# Patient Record
Sex: Female | Born: 1960
Health system: Southern US, Community
[De-identification: ages and names within clinical notes are randomized; demographics above are authoritative.]

## PROBLEM LIST (undated history)

## (undated) DIAGNOSIS — K603 Anal fistula, unspecified: Secondary | ICD-10-CM

## (undated) DIAGNOSIS — I493 Ventricular premature depolarization: Secondary | ICD-10-CM

## (undated) DIAGNOSIS — Z8719 Personal history of other diseases of the digestive system: Secondary | ICD-10-CM

## (undated) DIAGNOSIS — E785 Hyperlipidemia, unspecified: Secondary | ICD-10-CM

## (undated) DIAGNOSIS — Z973 Presence of spectacles and contact lenses: Secondary | ICD-10-CM

## (undated) HISTORY — PX: OTHER SURGICAL HISTORY: SHX169

## (undated) HISTORY — DX: Ventricular premature depolarization: I49.3

---

## 1998-01-24 ENCOUNTER — Other Ambulatory Visit: Admission: RE | Admit: 1998-01-24 | Discharge: 1998-01-24 | Payer: Self-pay | Admitting: Gynecology

## 1998-02-05 ENCOUNTER — Other Ambulatory Visit: Admission: RE | Admit: 1998-02-05 | Discharge: 1998-02-05 | Payer: Self-pay | Admitting: Obstetrics and Gynecology

## 1999-07-17 ENCOUNTER — Other Ambulatory Visit: Admission: RE | Admit: 1999-07-17 | Discharge: 1999-07-17 | Payer: Self-pay | Admitting: Gynecology

## 2002-11-01 ENCOUNTER — Other Ambulatory Visit: Admission: RE | Admit: 2002-11-01 | Discharge: 2002-11-01 | Payer: Self-pay | Admitting: Gynecology

## 2002-12-20 ENCOUNTER — Ambulatory Visit (HOSPITAL_BASED_OUTPATIENT_CLINIC_OR_DEPARTMENT_OTHER): Admission: RE | Admit: 2002-12-20 | Discharge: 2002-12-20 | Payer: Self-pay | Admitting: Plastic Surgery

## 2002-12-20 ENCOUNTER — Encounter (INDEPENDENT_AMBULATORY_CARE_PROVIDER_SITE_OTHER): Payer: Self-pay | Admitting: *Deleted

## 2003-12-05 ENCOUNTER — Other Ambulatory Visit: Admission: RE | Admit: 2003-12-05 | Discharge: 2003-12-05 | Payer: Self-pay | Admitting: Gynecology

## 2004-01-29 ENCOUNTER — Ambulatory Visit (HOSPITAL_BASED_OUTPATIENT_CLINIC_OR_DEPARTMENT_OTHER): Admission: RE | Admit: 2004-01-29 | Discharge: 2004-01-29 | Payer: Self-pay | Admitting: Surgery

## 2004-12-08 ENCOUNTER — Other Ambulatory Visit: Admission: RE | Admit: 2004-12-08 | Discharge: 2004-12-08 | Payer: Self-pay | Admitting: Gynecology

## 2005-12-30 ENCOUNTER — Ambulatory Visit (HOSPITAL_COMMUNITY): Admission: RE | Admit: 2005-12-30 | Discharge: 2005-12-30 | Payer: Self-pay | Admitting: Gynecology

## 2006-01-26 ENCOUNTER — Other Ambulatory Visit: Admission: RE | Admit: 2006-01-26 | Discharge: 2006-01-26 | Payer: Self-pay | Admitting: Gynecology

## 2007-04-22 ENCOUNTER — Ambulatory Visit (HOSPITAL_COMMUNITY): Admission: RE | Admit: 2007-04-22 | Discharge: 2007-04-22 | Payer: Self-pay | Admitting: Gynecology

## 2007-09-26 ENCOUNTER — Ambulatory Visit (HOSPITAL_COMMUNITY): Admission: RE | Admit: 2007-09-26 | Discharge: 2007-09-26 | Payer: Self-pay | Admitting: Obstetrics and Gynecology

## 2007-10-14 ENCOUNTER — Encounter: Admission: RE | Admit: 2007-10-14 | Discharge: 2007-10-14 | Payer: Self-pay | Admitting: Obstetrics and Gynecology

## 2008-05-17 ENCOUNTER — Ambulatory Visit (HOSPITAL_COMMUNITY): Admission: RE | Admit: 2008-05-17 | Discharge: 2008-05-17 | Payer: Self-pay | Admitting: Obstetrics and Gynecology

## 2009-07-01 ENCOUNTER — Ambulatory Visit (HOSPITAL_COMMUNITY): Admission: RE | Admit: 2009-07-01 | Discharge: 2009-07-01 | Payer: Self-pay | Admitting: Obstetrics and Gynecology

## 2009-07-05 ENCOUNTER — Encounter: Admission: RE | Admit: 2009-07-05 | Discharge: 2009-07-05 | Payer: Self-pay | Admitting: Obstetrics and Gynecology

## 2010-09-25 ENCOUNTER — Ambulatory Visit (HOSPITAL_COMMUNITY)
Admission: RE | Admit: 2010-09-25 | Discharge: 2010-09-25 | Payer: Self-pay | Source: Home / Self Care | Attending: Obstetrics and Gynecology | Admitting: Obstetrics and Gynecology

## 2010-10-27 ENCOUNTER — Encounter: Payer: Self-pay | Admitting: Obstetrics and Gynecology

## 2010-10-27 ENCOUNTER — Encounter: Payer: Self-pay | Admitting: Neurosurgery

## 2011-02-20 NOTE — Op Note (Signed)
NAME:  Kim Newton, Kim Newton                          ACCOUNT NO.:  [id number]   MEDICAL RECORD NO.:  [id number]                   PATIENT TYPE:  AMB   LOCATION:  DSC                                  FACILITY:  [facility]   PHYSICIAN:  Velora Heckler, M.D.                DATE OF BIRTH:  08-Sep-1961   DATE OF PROCEDURE:  01/29/2004  DATE OF DISCHARGE:                                 OPERATIVE REPORT   PREOPERATIVE DIAGNOSIS:  1. Umbilical hernia, reducible.  2. Ventral hernia, incarcerated.   POSTOPERATIVE DIAGNOSIS:  1. Umbilical hernia, reducible.  2. Ventral hernia, incarcerated.   PROCEDURE:  1. Repair of umbilical hernia.  2. Repair of ventral hernia.   SURGEON:  Velora Heckler, M.D.   ANESTHESIA:  General.   ESTIMATED BLOOD LOSS:  Minimal.   PREPARATION:  Betadine.   COMPLICATIONS:  None.   INDICATIONS FOR PROCEDURE:  The patient is a 50 year old female previously  evaluated in September of 2003.  She has had an enlarging umbilical and  ventral hernia over the past two years.  It causes her minor discomfort.  She now desires repair.   DESCRIPTION OF PROCEDURE:  The procedure is done in OR#3 at the [facility].  The patient is brought to the operating room and placed in  the supine position on the operating table.  Following administration of  general anesthesia, the patient was prepped and draped in the usual strict  aseptic fashion.  After ascertaining that an adequate level of anesthesia  had been obtained, an incision is made in the midline just above the  umbilicus.  Dissection is carried down to the fascia.  Dissection is carried  beneath the umbilicus and the umbilicus is mobilized off the abdominal wall.  Hernia sac is identified.  It is dissected out.  Fascial defect measures 1  cm in diameter.  The sac is freed from the edges of the hernia defect and  reduced back within the peritoneal cavity.  The hernia defect is closed  transversely with interrupted 0  Ethibond sutures.  Further exploration  cephalad in the midline reveals a soft tissue mass measuring approximately 3  cm in greatest dimension. This was carefully dissected out. It represents a  hernia sac containing incarcerated adipose tissue.  It is dissected out of  the subcutaneous tissues and the fascial defect is identified in the  midline.  Fatty tissue is excised at the level of the defect.  The defect  measures approximately 5 mm in diameter.  It was closed with interrupted 0  Ethibond sutures as well.  Good hemostasis is noted.  Umbilicus is reaffixed  to the abdominal wall with interrupted 3-0 Vicryl suture.  The subcutaneous  tissues are closed with interrupted 3-0 Vicryl sutures.  The skin is closed  with a running 4-0 Vicryl subcuticular suture.  Field block is placed with Marcaine.  Benzoin  and Steri-Strips are applied.  Sterile dressings are applied. The patient is awakened from anesthesia and  brought to the recovery room in stable condition.  The patient tolerated the  procedure well.                                               Velora Heckler, M.D.    TMG/MEDQ  D:  01/29/2004  T:  01/29/2004  Job:  [id number]   cc:   [name]. Audie Box, M.D.  [address], [address]  [place]  [place] [address]  Fax: [phone]

## 2011-09-11 ENCOUNTER — Encounter: Payer: Self-pay | Admitting: *Deleted

## 2011-12-07 ENCOUNTER — Other Ambulatory Visit: Payer: Self-pay | Admitting: Obstetrics and Gynecology

## 2011-12-07 ENCOUNTER — Ambulatory Visit (HOSPITAL_COMMUNITY)
Admission: RE | Admit: 2011-12-07 | Discharge: 2011-12-07 | Disposition: A | Discharge status: Home / Self Care | Payer: BC Managed Care – PPO | Source: Ambulatory Visit | Attending: Obstetrics and Gynecology | Admitting: Obstetrics and Gynecology

## 2011-12-07 DIAGNOSIS — Z1231 Encounter for screening mammogram for malignant neoplasm of breast: Secondary | ICD-10-CM

## 2011-12-07 DIAGNOSIS — Z1239 Encounter for other screening for malignant neoplasm of breast: Secondary | ICD-10-CM

## 2011-12-31 LAB — HM COLONOSCOPY

## 2012-07-12 ENCOUNTER — Ambulatory Visit: Payer: Self-pay | Admitting: Obstetrics and Gynecology

## 2012-12-19 ENCOUNTER — Other Ambulatory Visit (HOSPITAL_COMMUNITY): Payer: Self-pay | Admitting: Obstetrics & Gynecology

## 2012-12-19 DIAGNOSIS — Z1231 Encounter for screening mammogram for malignant neoplasm of breast: Secondary | ICD-10-CM

## 2012-12-27 ENCOUNTER — Ambulatory Visit (HOSPITAL_COMMUNITY)
Admission: RE | Admit: 2012-12-27 | Discharge: 2012-12-27 | Disposition: A | Discharge status: Home / Self Care | Payer: 59 | Source: Ambulatory Visit | Attending: Obstetrics & Gynecology | Admitting: Obstetrics & Gynecology

## 2012-12-27 DIAGNOSIS — Z1231 Encounter for screening mammogram for malignant neoplasm of breast: Secondary | ICD-10-CM

## 2014-07-18 ENCOUNTER — Other Ambulatory Visit: Payer: Self-pay | Admitting: Obstetrics & Gynecology

## 2014-07-18 DIAGNOSIS — N6452 Nipple discharge: Secondary | ICD-10-CM

## 2014-07-27 ENCOUNTER — Ambulatory Visit
Admission: RE | Admit: 2014-07-27 | Discharge: 2014-07-27 | Disposition: A | Discharge status: Home / Self Care | Payer: 59 | Source: Ambulatory Visit | Attending: Obstetrics & Gynecology | Admitting: Obstetrics & Gynecology

## 2014-07-27 ENCOUNTER — Other Ambulatory Visit: Payer: Self-pay | Admitting: Obstetrics & Gynecology

## 2014-07-27 ENCOUNTER — Encounter (INDEPENDENT_AMBULATORY_CARE_PROVIDER_SITE_OTHER): Payer: Self-pay

## 2014-07-27 DIAGNOSIS — N6452 Nipple discharge: Secondary | ICD-10-CM

## 2014-07-27 DIAGNOSIS — N6320 Unspecified lump in the left breast, unspecified quadrant: Secondary | ICD-10-CM

## 2014-08-13 ENCOUNTER — Other Ambulatory Visit: Payer: Self-pay | Admitting: Obstetrics & Gynecology

## 2014-08-13 DIAGNOSIS — N6320 Unspecified lump in the left breast, unspecified quadrant: Secondary | ICD-10-CM

## 2014-08-13 DIAGNOSIS — N6452 Nipple discharge: Secondary | ICD-10-CM

## 2014-08-15 ENCOUNTER — Ambulatory Visit
Admission: RE | Admit: 2014-08-15 | Discharge: 2014-08-15 | Disposition: A | Discharge status: Home / Self Care | Payer: 59 | Source: Ambulatory Visit | Attending: Obstetrics & Gynecology | Admitting: Obstetrics & Gynecology

## 2014-08-15 DIAGNOSIS — N6452 Nipple discharge: Secondary | ICD-10-CM

## 2014-08-15 DIAGNOSIS — N6320 Unspecified lump in the left breast, unspecified quadrant: Secondary | ICD-10-CM

## 2015-01-11 ENCOUNTER — Other Ambulatory Visit: Payer: Self-pay | Admitting: Obstetrics & Gynecology

## 2015-01-11 DIAGNOSIS — N6009 Solitary cyst of unspecified breast: Secondary | ICD-10-CM

## 2015-01-31 ENCOUNTER — Ambulatory Visit
Admission: RE | Admit: 2015-01-31 | Discharge: 2015-01-31 | Disposition: A | Discharge status: Home / Self Care | Payer: 59 | Source: Ambulatory Visit | Attending: Obstetrics & Gynecology | Admitting: Obstetrics & Gynecology

## 2015-01-31 DIAGNOSIS — N6009 Solitary cyst of unspecified breast: Secondary | ICD-10-CM

## 2015-03-07 ENCOUNTER — Encounter: Payer: Self-pay | Admitting: Podiatry

## 2015-03-07 ENCOUNTER — Ambulatory Visit (INDEPENDENT_AMBULATORY_CARE_PROVIDER_SITE_OTHER): Payer: 59 | Admitting: Podiatry

## 2015-03-07 ENCOUNTER — Ambulatory Visit (INDEPENDENT_AMBULATORY_CARE_PROVIDER_SITE_OTHER): Payer: 59

## 2015-03-07 VITALS — BP 116/73 | HR 76 | Resp 16

## 2015-03-07 DIAGNOSIS — L84 Corns and callosities: Secondary | ICD-10-CM | POA: Diagnosis not present

## 2015-03-07 DIAGNOSIS — M2040 Other hammer toe(s) (acquired), unspecified foot: Secondary | ICD-10-CM | POA: Diagnosis not present

## 2015-03-07 DIAGNOSIS — M79675 Pain in left toe(s): Secondary | ICD-10-CM

## 2015-03-07 NOTE — Progress Notes (Signed)
° °  Subjective:    Patient ID: Kim Newton, female    DOB: 07/27/1961, 54 y.o.   MRN: [id number]  HPI  N: Lambert ModySharp (when it hurts, it hurts) L: Left pinky toe D: Couple months O: Sudden C: Painful A: Walking, tightness/pressure of shoes T: Scrub dead skin off while in tub/shower    Review of Systems  HENT: Positive for sinus pressure and sneezing.   Hematological: Bruises/bleeds easily.  All other systems reviewed and are negative.      Objective:   Physical Exam: I have reviewed her past medical history medications allergies surgery social history and review of systems. Pulses are strongly palpable bilateral. An neurologic sensorium is intact per Semmes-Weinstein monofilament. Deep tendon reflexes are intact bilateral and muscle strength +5 over 5 dorsiflexion plantar flexors inverters and everters all intrinsic musculature is intact. Orthopedic evaluation demonstrates congenital adductovarus rotated hammertoe deformities fifth digits bilaterally associated with reactive hyperkeratotic home as to the dorsolateral aspect of these toes.        Assessment & Plan:  Assessment: Reactive hyperkeratosis was fifth digits bilaterally associated with thick congenital adductovarus rotated hammertoe deformities fifth bilateral.  Plan: Discussed etiology pathology conservative versus surgical therapies. Debrided reactive hyperkeratosis today for follow-up with her in the future for surgical consult.

## 2015-08-09 ENCOUNTER — Other Ambulatory Visit: Payer: Self-pay

## 2015-08-09 DIAGNOSIS — Z1231 Encounter for screening mammogram for malignant neoplasm of breast: Secondary | ICD-10-CM

## 2015-09-16 ENCOUNTER — Ambulatory Visit
Admission: RE | Admit: 2015-09-16 | Discharge: 2015-09-16 | Disposition: A | Discharge status: Home / Self Care | Payer: 59 | Source: Ambulatory Visit

## 2015-09-16 DIAGNOSIS — Z1231 Encounter for screening mammogram for malignant neoplasm of breast: Secondary | ICD-10-CM

## 2016-02-03 ENCOUNTER — Other Ambulatory Visit: Payer: Self-pay

## 2016-02-03 DIAGNOSIS — Z1231 Encounter for screening mammogram for malignant neoplasm of breast: Secondary | ICD-10-CM

## 2016-08-21 LAB — LIPID PANEL
CHOLESTEROL: 257 — AB (ref 0–200)
HDL: 88 — AB (ref 35–70)
LDL CALC: 152
TRIGLYCERIDES: 86 (ref 40–160)

## 2016-08-21 LAB — HEPATIC FUNCTION PANEL
ALK PHOS: 45 (ref 25–125)
ALT: 10 (ref 7–35)
AST: 14 (ref 13–35)

## 2016-08-21 LAB — BASIC METABOLIC PANEL
BUN: 11 (ref 4–21)
CREATININE: 1 (ref <=1.1)
Glucose: 86
Potassium: 4.1 (ref 3.4–5.3)
Sodium: 140 (ref 137–147)

## 2017-03-09 LAB — LIPID PANEL
Cholesterol: 228 — AB (ref 0–200)
HDL: 92 — AB (ref 35–70)
LDL Cholesterol: 125
Triglycerides: 55 (ref 40–160)

## 2017-07-23 ENCOUNTER — Ambulatory Visit: Payer: Self-pay | Admitting: General Surgery

## 2017-07-23 NOTE — H&P (Signed)
History of Present Illness Romie Levee MD; 07/23/2017 1:39 PM) The patient is a 56 year old female who presents with a complaint of anal problems. 56 year old female who presents to the office with complaints of a perirectal abscess. She states that this started in June. She has underwent 2 drainage procedures as well as multiple rounds of antibiotics but she continues to have soreness and drainage in the area. Currently she describes a sore spot when wiping and occasional bloody drainage. She has never had anything like this before. She states she has regular bowel habits and denies any chronic diarrhea.   Problem List/Past Medical Michel Bickers, LPN; 40/98/1191 2:16 PM) ANAL FISTULA (K60.3)  Past Surgical History Michel Bickers, LPN; 47/82/9562 2:19 PM) Ventral / Umbilical Hernia Surgery Cesarean Delivery  Diagnostic Studies History Michel Bickers, LPN; 13/05/6577 2:18 PM) Colonoscopy  Allergies Michel Bickers, LPN; 46/96/2952 1:35 PM) No Known Drug Allergies 07/23/2017  Medication History Michel Bickers, LPN; 84/13/2440 1:36 PM) Climara (0.0375MG /10UV Patch Weekly, Transdermal) Active. Progesterone Micronized (100MG  Capsule, Oral) Active. Multivitamin Adult (Oral) Active. Medications Reconciled  Social History Michel Bickers, LPN; 25/36/6440 2:15 PM) Current tobacco use Never smoker. Alcohol use Occasional alcohol use. Caffeine use Coffee, Tea. No drug use  Family History Michel Bickers, LPN; 34/74/2595 2:15 PM) Colon Cancer Mother.  Pregnancy / Birth History Michel Bickers, LPN; 63/87/5643 2:18 PM) Age at menarche 13 years. Irregular periods Age of menopause 24-50. Pregnancies (Gravida) 2. Deliveries (Parity) 2. Length (months) of breastfeeding 3-6 months. Mammogram within the last year. Pap smear 1-5 years ago.  Other Problems Michel Bickers, LPN; 32/95/1884 2:16 PM) High Cholesterol     Review of Systems Romie Levee MD;  07/23/2017 2:27 PM) General Not Present- Appetite Loss, Chills, Fatigue, Fever, Night Sweats, Weight Gain and Weight Loss. Note: All other systems negative (unless as noted in HPI & included Review of Systems) Skin Not Present- Change in Wart/Mole, Dryness, Hives, Jaundice, New Lesions, Non-Healing Wounds, Rash and Ulcer. HEENT Not Present- Earache, Hearing Loss, Hoarseness, Nose Bleed, Oral Ulcers, Ringing in the Ears, Seasonal Allergies, Sinus Pain, Sore Throat, Visual Disturbances, Wears glasses/contact lenses and Yellow Eyes. Respiratory Not Present- Bloody sputum, Chronic Cough, Difficulty Breathing, Snoring and Wheezing. Breast Not Present- Breast Mass, Breast Pain, Nipple Discharge and Skin Changes. Cardiovascular Not Present- Chest Pain, Difficulty Breathing Lying Down, Leg Cramps, Palpitations, Rapid Heart Rate, Shortness of Breath and Swelling of Extremities. Gastrointestinal Not Present- Abdominal Pain, Bloating, Bloody Stool, Change in Bowel Habits, Chronic diarrhea, Constipation, Difficulty Swallowing, Excessive gas, Gets full quickly at meals, Hemorrhoids, Indigestion, Nausea, Rectal Pain and Vomiting. Female Genitourinary Not Present- Frequency, Nocturia, Painful Urination, Pelvic Pain and Urgency. Musculoskeletal Not Present- Back Pain, Joint Pain, Joint Stiffness, Muscle Pain, Muscle Weakness and Swelling of Extremities. Neurological Not Present- Decreased Memory, Fainting, Headaches, Numbness, Seizures, Tingling, Tremor, Trouble walking and Weakness. Psychiatric Not Present- Anxiety, Bipolar, Change in Sleep Pattern, Depression, Fearful and Frequent crying. Endocrine Not Present- Cold Intolerance, Excessive Hunger, Hair Changes, Heat Intolerance, Hot flashes and New Diabetes. Hematology Not Present- Easy Bruising, Excessive bleeding, Gland problems, HIV and Persistent Infections.  Vitals Tresa Endo Dockery LPN; 16/60/6301 1:35 PM) 07/23/2017 1:35 PM Weight: 121.4 lb Height:  61in Body Surface Area: 1.53 m Body Mass Index: 22.94 kg/m  Temp.: 98.14F(Oral)  Pulse: 71 (Regular)  BP: 118/64 (Sitting, Left Arm, Standard)      Physical Exam Romie Levee MD; 07/23/2017 1:49 PM)  General Mental Status-Alert. General Appearance-Not in acute distress. Build & Nutrition-Well nourished. Posture-Normal posture.  Gait-Normal.  Head and Neck Head-normocephalic, atraumatic with no lesions or palpable masses. Trachea-midline.  Chest and Lung Exam Chest and lung exam reveals -on auscultation, normal breath sounds, no adventitious sounds and normal vocal resonance.  Cardiovascular Cardiovascular examination reveals -normal heart sounds, regular rate and rhythm with no murmurs and no digital clubbing, cyanosis, edema, increased warmth or tenderness.  Abdomen Inspection Inspection of the abdomen reveals - No Hernias. Palpation/Percussion Palpation and Percussion of the abdomen reveal - Soft, Non Tender, No Rigidity (guarding), No hepatosplenomegaly and No Palpable abdominal masses.  Rectal Anorectal Exam External - Note: L lateral opening draining purulence.  Neurologic Neurologic evaluation reveals -alert and oriented x 3 with no impairment of recent or remote memory, normal attention span and ability to concentrate, normal sensation and normal coordination.  Musculoskeletal Normal Exam - Bilateral-Upper Extremity Strength Normal and Lower Extremity Strength Normal.    Assessment & Plan Romie Levee(Maelin Kurkowski MD; 07/23/2017 1:52 PM)  ANAL FISTULA (K60.3) Impression: 56 year old female status post perianal abscess approximately 3 months ago. Since to the office with persistently draining wound. Most likely this is a perianal abscess. I have recommended an exam under anesthesia with possible fistulotomy versus seton placement. The risk and benefits of both procedures were explained to the patient including a risk of incontinence with  fistulotomy and a higher risk of recurrence and need for second procedure with seton placement. I believe she understands this and detail and has no further questions. We will get this scheduled at her convenience.

## 2017-09-29 ENCOUNTER — Other Ambulatory Visit: Payer: Self-pay

## 2017-09-29 ENCOUNTER — Encounter (HOSPITAL_BASED_OUTPATIENT_CLINIC_OR_DEPARTMENT_OTHER): Payer: Self-pay | Admitting: *Deleted

## 2017-09-29 NOTE — Progress Notes (Signed)
SPOKE W/ PT VIA PHONE FOR PRE-OP INTERVIEW.  NPO AFTER MN.  ARRIVE AT 0530.  NEEDS HG. 

## 2017-10-07 ENCOUNTER — Ambulatory Visit (HOSPITAL_BASED_OUTPATIENT_CLINIC_OR_DEPARTMENT_OTHER): Admission: RE | Admit: 2017-10-07 | Payer: 59 | Source: Ambulatory Visit | Admitting: General Surgery

## 2017-10-07 HISTORY — DX: Personal history of other diseases of the digestive system: Z87.19

## 2017-10-07 HISTORY — DX: Hyperlipidemia, unspecified: E78.5

## 2017-10-07 HISTORY — DX: Presence of spectacles and contact lenses: Z97.3

## 2017-10-07 HISTORY — DX: Anal fistula, unspecified: K60.30

## 2017-10-07 HISTORY — DX: Anal fistula: K60.3

## 2017-10-07 SURGERY — EXAM UNDER ANESTHESIA WITH FISTULECTOMY
Anesthesia: Monitor Anesthesia Care

## 2017-10-19 LAB — HM DEXA SCAN
HM DEXA SCAN: 3
HM Dexa Scan: -3

## 2017-10-19 LAB — LIPID PANEL
CHOLESTEROL: 269 — AB (ref 0–200)
HDL: 97 — AB (ref 35–70)
LDL CALC: 156
Triglycerides: 69 (ref 40–160)

## 2017-10-19 LAB — HM PAP SMEAR: HM PAP: NORMAL

## 2017-10-20 ENCOUNTER — Other Ambulatory Visit: Payer: Self-pay | Admitting: Obstetrics & Gynecology

## 2017-10-20 DIAGNOSIS — N6310 Unspecified lump in the right breast, unspecified quadrant: Secondary | ICD-10-CM

## 2017-10-25 ENCOUNTER — Ambulatory Visit
Admission: RE | Admit: 2017-10-25 | Discharge: 2017-10-25 | Disposition: A | Discharge status: Home / Self Care | Payer: 59 | Source: Ambulatory Visit | Attending: Obstetrics & Gynecology | Admitting: Obstetrics & Gynecology

## 2017-10-25 DIAGNOSIS — N6310 Unspecified lump in the right breast, unspecified quadrant: Secondary | ICD-10-CM

## 2017-10-25 LAB — HM MAMMOGRAPHY

## 2017-11-09 ENCOUNTER — Telehealth: Payer: Self-pay | Admitting: General Practice

## 2017-11-09 NOTE — Telephone Encounter (Signed)
Copied from CRM [id number]. Topic: Appointment Scheduling - Scheduling Inquiry for Clinic >> Nov 09, 2017  3:00 PM Waymon AmatoBurton, Donna F wrote: Reason for CRM: pt is requesting that she become a patient of Dr. Okey Duprerawford, and she was recommended by the patient friend sheby luck newton  Best number for patient is [phone]

## 2017-11-15 NOTE — Telephone Encounter (Signed)
Fine

## 2017-11-16 NOTE — Telephone Encounter (Signed)
appt made

## 2017-11-23 ENCOUNTER — Encounter: Payer: Self-pay | Admitting: Internal Medicine

## 2017-11-23 ENCOUNTER — Ambulatory Visit: Payer: 59 | Admitting: Internal Medicine

## 2017-11-23 ENCOUNTER — Other Ambulatory Visit: Payer: 59

## 2017-11-23 VITALS — BP 112/78 | HR 86 | Temp 98.1°F | Wt 120.0 lb

## 2017-11-23 DIAGNOSIS — E785 Hyperlipidemia, unspecified: Secondary | ICD-10-CM

## 2017-11-23 DIAGNOSIS — Z1159 Encounter for screening for other viral diseases: Secondary | ICD-10-CM

## 2017-11-23 DIAGNOSIS — Z0000 Encounter for general adult medical examination without abnormal findings: Secondary | ICD-10-CM | POA: Diagnosis not present

## 2017-11-23 NOTE — Patient Instructions (Addendum)
We will check for the hepatitis C today. We will call you back about the results.   Come back in abut 1-2 years for a well visit. Call us sooner if needed for any health concerns or problems. We are also open on Saturday for acute sickness from 9-2. You can call on Friday starting at noon or Saturday for a visit.   We will call you back about the shingles vaccine to come get it.   We will get the records from the colonoscopy.   Health Maintenance, Female Adopting a healthy lifestyle and getting preventive care can go a long way to promote health and wellness. Talk with your health care provider about what schedule of regular examinations is right for you. This is a good chance for you to check in with your provider about disease prevention and staying healthy. In between checkups, there are plenty of things you can do on your own. Experts have done a lot of research about which lifestyle changes and preventive measures are most likely to keep you healthy. Ask your health care provider for more information. Weight and diet Eat a healthy diet  Be sure to include plenty of vegetables, fruits, low-fat dairy products, and lean protein.  Do not eat a lot of foods high in solid fats, added sugars, or salt.  Get regular exercise. This is one of the most important things you can do for your health. ? Most adults should exercise for at least 150 minutes each week. The exercise should increase your heart rate and make you sweat (moderate-intensity exercise). ? Most adults should also do strengthening exercises at least twice a week. This is in addition to the moderate-intensity exercise.  Maintain a healthy weight  Body mass index (BMI) is a measurement that can be used to identify possible weight problems. It estimates body fat based on height and weight. Your health care provider can help determine your BMI and help you achieve or maintain a healthy weight.  For females 17 years of age and  older: ? A BMI below 18.5 is considered underweight. ? A BMI of 18.5 to 24.9 is normal. ? A BMI of 25 to 29.9 is considered overweight. ? A BMI of 30 and above is considered obese.  Watch levels of cholesterol and blood lipids  You should start having your blood tested for lipids and cholesterol at 59 years of age, then have this test every 5 years.  You may need to have your cholesterol levels checked more often if: ? Your lipid or cholesterol levels are high. ? You are older than 59 years of age. ? You are at high risk for heart disease.  Cancer screening Lung Cancer  Lung cancer screening is recommended for adults 36-80 years old who are at high risk for lung cancer because of a history of smoking.  A yearly low-dose CT scan of the lungs is recommended for people who: ? Currently smoke. ? Have quit within the past 15 years. ? Have at least a 30-pack-year history of smoking. A pack year is smoking an average of one pack of cigarettes a day for 1 year.  Yearly screening should continue until it has been 15 years since you quit.  Yearly screening should stop if you develop a health problem that would prevent you from having lung cancer treatment.  Breast Cancer  Practice breast self-awareness. This means understanding how your breasts normally appear and feel.  It also means doing regular breast self-exams. Let your  health care provider know about any changes, no matter how small.  If you are in your 20s or 30s, you should have a clinical breast exam (CBE) by a health care provider every 1-3 years as part of a regular health exam.  If you are 25 or older, have a CBE every year. Also consider having a breast X-ray (mammogram) every year.  If you have a family history of breast cancer, talk to your health care provider about genetic screening.  If you are at high risk for breast cancer, talk to your health care provider about having an MRI and a mammogram every year.  Breast  cancer gene (BRCA) assessment is recommended for women who have family members with BRCA-related cancers. BRCA-related cancers include: ? Breast. ? Ovarian. ? Tubal. ? Peritoneal cancers.  Results of the assessment will determine the need for genetic counseling and BRCA1 and BRCA2 testing.  Cervical Cancer Your health care provider may recommend that you be screened regularly for cancer of the pelvic organs (ovaries, uterus, and vagina). This screening involves a pelvic examination, including checking for microscopic changes to the surface of your cervix (Pap test). You may be encouraged to have this screening done every 3 years, beginning at age 19.  For women ages 93-65, health care providers may recommend pelvic exams and Pap testing every 3 years, or they may recommend the Pap and pelvic exam, combined with testing for human papilloma virus (HPV), every 5 years. Some types of HPV increase your risk of cervical cancer. Testing for HPV may also be done on women of any age with unclear Pap test results.  Other health care providers may not recommend any screening for nonpregnant women who are considered low risk for pelvic cancer and who do not have symptoms. Ask your health care provider if a screening pelvic exam is right for you.  If you have had past treatment for cervical cancer or a condition that could lead to cancer, you need Pap tests and screening for cancer for at least 20 years after your treatment. If Pap tests have been discontinued, your risk factors (such as having a new sexual partner) need to be reassessed to determine if screening should resume. Some women have medical problems that increase the chance of getting cervical cancer. In these cases, your health care provider may recommend more frequent screening and Pap tests.  Colorectal Cancer  This type of cancer can be detected and often prevented.  Routine colorectal cancer screening usually begins at 59 years of age and  continues through 59 years of age.  Your health care provider may recommend screening at an earlier age if you have risk factors for colon cancer.  Your health care provider may also recommend using home test kits to check for hidden blood in the stool.  A small camera at the end of a tube can be used to examine your colon directly (sigmoidoscopy or colonoscopy). This is done to check for the earliest forms of colorectal cancer.  Routine screening usually begins at age 39.  Direct examination of the colon should be repeated every 5-10 years through 59 years of age. However, you may need to be screened more often if early forms of precancerous polyps or small growths are found.  Skin Cancer  Check your skin from head to toe regularly.  Tell your health care provider about any new moles or changes in moles, especially if there is a change in a mole's shape or color.  Also  tell your health care provider if you have a mole that is larger than the size of a pencil eraser.  Always use sunscreen. Apply sunscreen liberally and repeatedly throughout the day.  Protect yourself by wearing long sleeves, pants, a wide-brimmed hat, and sunglasses whenever you are outside.  Heart disease, diabetes, and high blood pressure  High blood pressure causes heart disease and increases the risk of stroke. High blood pressure is more likely to develop in: ? People who have blood pressure in the high end of the normal range (130-139/85-89 mm Hg). ? People who are overweight or obese. ? People who are African American.  If you are 85-33 years of age, have your blood pressure checked every 3-5 years. If you are 2 years of age or older, have your blood pressure checked every year. You should have your blood pressure measured twice-once when you are at a hospital or clinic, and once when you are not at a hospital or clinic. Record the average of the two measurements. To check your blood pressure when you are not  at a hospital or clinic, you can use: ? An automated blood pressure machine at a pharmacy. ? A home blood pressure monitor.  If you are between 44 years and 10 years old, ask your health care provider if you should take aspirin to prevent strokes.  Have regular diabetes screenings. This involves taking a blood sample to check your fasting blood sugar level. ? If you are at a normal weight and have a low risk for diabetes, have this test once every three years after 59 years of age. ? If you are overweight and have a high risk for diabetes, consider being tested at a younger age or more often. Preventing infection Hepatitis B  If you have a higher risk for hepatitis B, you should be screened for this virus. You are considered at high risk for hepatitis B if: ? You were born in a country where hepatitis B is common. Ask your health care provider which countries are considered high risk. ? Your parents were born in a high-risk country, and you have not been immunized against hepatitis B (hepatitis B vaccine). ? You have HIV or AIDS. ? You use needles to inject street drugs. ? You live with someone who has hepatitis B. ? You have had sex with someone who has hepatitis B. ? You get hemodialysis treatment. ? You take certain medicines for conditions, including cancer, organ transplantation, and autoimmune conditions.  Hepatitis C  Blood testing is recommended for: ? Everyone born from [birth year] through 1967. ? Anyone with known risk factors for hepatitis C.  Sexually transmitted infections (STIs)  You should be screened for sexually transmitted infections (STIs) including gonorrhea and chlamydia if: ? You are sexually active and are younger than 59 years of age. ? You are older than 59 years of age and your health care provider tells you that you are at risk for this type of infection. ? Your sexual activity has changed since you were last screened and you are at an increased risk for chlamydia  or gonorrhea. Ask your health care provider if you are at risk.  If you do not have HIV, but are at risk, it may be recommended that you take a prescription medicine daily to prevent HIV infection. This is called pre-exposure prophylaxis (PrEP). You are considered at risk if: ? You are sexually active and do not regularly use condoms or know the HIV status of your  partner(s). ? You take drugs by injection. ? You are sexually active with a partner who has HIV.  Talk with your health care provider about whether you are at high risk of being infected with HIV. If you choose to begin PrEP, you should first be tested for HIV. You should then be tested every 3 months for as long as you are taking PrEP. Pregnancy  If you are premenopausal and you may become pregnant, ask your health care provider about preconception counseling.  If you may become pregnant, take 400 to 800 micrograms (mcg) of folic acid every day.  If you want to prevent pregnancy, talk to your health care provider about birth control (contraception). Osteoporosis and menopause  Osteoporosis is a disease in which the bones lose minerals and strength with aging. This can result in serious bone fractures. Your risk for osteoporosis can be identified using a bone density scan.  If you are 93 years of age or older, or if you are at risk for osteoporosis and fractures, ask your health care provider if you should be screened.  Ask your health care provider whether you should take a calcium or vitamin D supplement to lower your risk for osteoporosis.  Menopause may have certain physical symptoms and risks.  Hormone replacement therapy may reduce some of these symptoms and risks. Talk to your health care provider about whether hormone replacement therapy is right for you. Follow these instructions at home:  Schedule regular health, dental, and eye exams.  Stay current with your immunizations.  Do not use any tobacco products  including cigarettes, chewing tobacco, or electronic cigarettes.  If you are pregnant, do not drink alcohol.  If you are breastfeeding, limit how much and how often you drink alcohol.  Limit alcohol intake to no more than 1 drink per day for nonpregnant women. One drink equals 12 ounces of beer, 5 ounces of wine, or 1 ounces of hard liquor.  Do not use street drugs.  Do not share needles.  Ask your health care provider for help if you need support or information about quitting drugs.  Tell your health care provider if you often feel depressed.  Tell your health care provider if you have ever been abused or do not feel safe at home. This information is not intended to replace advice given to you by your health care provider. Make sure you discuss any questions you have with your health care provider. Document Released: 04/06/2011 Document Revised: 02/27/2016 Document Reviewed: 06/25/2015 Elsevier Interactive Patient Education  [employer].

## 2017-11-23 NOTE — Assessment & Plan Note (Signed)
Reviewed labs with her, total 269, HDL 97, LDL 156, triglycerides 69. CV risk about 3% 10 year risk so statin no indicated, aspirin 81 mg not indicated. She is exercising and tries to have low fat diet.

## 2017-11-23 NOTE — Progress Notes (Signed)
Outside notes received. Information abstracted. Notes sent to scan.  

## 2017-11-23 NOTE — Assessment & Plan Note (Signed)
Pap smear up to date. Colonoscopy 2013 and recommended 5 year follow up and overdue with Dr. Madilyn FiremanHayes. Signed release to get records and she will call for follow up. Hep c screening done today. She declines tdap and hiv screening. Mammogram and bone density up to date. Counseled about sun safety and mole surveillance. Given screening recommendations.

## 2017-11-23 NOTE — Progress Notes (Signed)
   Subjective:    Patient ID: Kim Newton, female    DOB: 09/18/1961, 57 y.o.   MRN: [id number]  HPI The patient is a new 57 YO female coming in for high cholesterol. Previously has just been seeing gyn. She is not sure what those levels are but did bring them with her. They are reviewed with her during the visit. Denies smoking, hypertension, family history heart disease, diabetes. She has no significant PMH. She does take estrogen patch for hot flashes and progesterone. She does exercise and denies SOB or chest pains. Denies other symptoms.   [facility] for colon 2013, 5 year recall for family history   [facility], social history reviewed and updated  Review of Systems  Constitutional: Negative.   HENT: Negative.   Eyes: Negative.   Respiratory: Negative for cough, chest tightness and shortness of breath.   Cardiovascular: Negative for chest pain, palpitations and leg swelling.  Gastrointestinal: Negative for abdominal distention, abdominal pain, constipation, diarrhea, nausea and vomiting.  Musculoskeletal: Negative.   Skin: Negative.   Neurological: Negative.   Psychiatric/Behavioral: Negative.       Objective:   Physical Exam  Constitutional: She is oriented to person, place, and time. She appears well-developed and well-nourished.  HENT:  Head: Normocephalic and atraumatic.  Eyes: EOM are normal.  Neck: Normal range of motion.  Cardiovascular: Normal rate and regular rhythm.  Pulmonary/Chest: Effort normal and breath sounds normal. No respiratory distress. She has no wheezes. She has no rales.  Abdominal: Soft. Bowel sounds are normal. She exhibits no distension. There is no tenderness. There is no rebound.  Musculoskeletal: She exhibits no edema.  Neurological: She is alert and oriented to person, place, and time. Coordination normal.  Skin: Skin is warm and dry.  Psychiatric: She has a normal mood and affect.   Vitals:   11/23/17 1512  BP: 112/78  Pulse: 86  Temp: 98.1  F (36.7 C)  TempSrc: Oral  SpO2: 97%  Weight: 120 lb (54.4 kg)      Assessment & Plan:

## 2017-11-24 LAB — HEPATITIS C ANTIBODY
Hepatitis C Ab: NONREACTIVE
SIGNAL TO CUT-OFF: 0.01 (ref <1.00)

## 2017-11-25 ENCOUNTER — Encounter: Payer: Self-pay | Admitting: Internal Medicine

## 2017-11-25 LAB — COMPLETE METABOLIC PANEL WITH GFR
ALBUMIN: 4
Bilirubin: 0.7
CALCIUM: 9.1
CO2: 29
Chloride: 103
Total Protein: 7.2

## 2017-11-25 NOTE — Progress Notes (Signed)
Outside notes received. Information abstracted. Notes sent to scan.  

## 2017-11-25 NOTE — Progress Notes (Signed)
Outside notes received. Information abstracted. Notes sent to scan.11/17

## 2017-12-01 ENCOUNTER — Encounter: Payer: Self-pay | Admitting: Internal Medicine

## 2017-12-01 NOTE — Progress Notes (Signed)
Abstracted and sent to scan  

## 2017-12-07 ENCOUNTER — Telehealth: Payer: Self-pay | Admitting: Internal Medicine

## 2017-12-07 NOTE — Telephone Encounter (Signed)
[facility] 10 pages to Cheryll CockayneBurns Stacy MD

## 2017-12-14 ENCOUNTER — Encounter: Payer: Self-pay | Admitting: Internal Medicine

## 2017-12-14 NOTE — Progress Notes (Signed)
Result abstracted and sent to scan ° °

## 2018-02-02 ENCOUNTER — Ambulatory Visit (INDEPENDENT_AMBULATORY_CARE_PROVIDER_SITE_OTHER): Payer: BLUE CROSS/BLUE SHIELD

## 2018-02-02 DIAGNOSIS — Z299 Encounter for prophylactic measures, unspecified: Secondary | ICD-10-CM | POA: Diagnosis not present

## 2018-06-07 ENCOUNTER — Ambulatory Visit (INDEPENDENT_AMBULATORY_CARE_PROVIDER_SITE_OTHER): Payer: BLUE CROSS/BLUE SHIELD | Admitting: *Deleted

## 2018-06-07 DIAGNOSIS — Z23 Encounter for immunization: Secondary | ICD-10-CM

## 2018-06-10 ENCOUNTER — Ambulatory Visit: Payer: Self-pay | Admitting: Internal Medicine

## 2018-06-10 NOTE — Telephone Encounter (Signed)
Called in c/o itching at the injection site in her left arm from her 2nd Shingles injection on Tuesday.  It was red but she said it's not as red as it was. I let her know she was having a normal reaction to the vaccine.   I went over the home care advice with her to help with the itching which was her main complaint.   I encouraged her not to rub it hard when it itches. She thanked me for my advice.    I instructed her to call us back if my Monday she is not improved or improving.   She was agreeable to this. She is going to take a Claritin to see if that will help with the itching.  Reason for Disposition . Shingles (Herpes zoster; Shingrix) vaccine reactions  Answer Assessment - Initial Assessment Questions 1. SYMPTOMS: "What is the main symptom?" (e.g., redness, swelling, pain)      Got 2nd Shingles shot on Tuesday.   It itches and is red.   I've been rubbing it due to it itching.    It's red but not as bad as this morning. 2. ONSET: "When was the vaccine (shot) given?" "How much later did the Tuesday.__ begin?" (e.g., hours, days ago)      Not swollen.    My arm was sore with the first shot.   Left arm. 3. SEVERITY: "How bad is it?"      Red.   Not swollen. 4. FEVER: "Is there a fever?" If so, ask: "What is it, how was it measured, and when did it start?"      No fever.    5. IMMUNIZATIONS GIVEN: "What shots have you recently received?"     Shingles shot 2nd one. 6. PAST REACTIONS: "Have you reacted to immunizations before?" If so, ask: "What happened?"     No 7. OTHER SYMPTOMS: "Do you have any other symptoms?"     No other symptoms.  Protocols used: IMMUNIZATION REACTIONS-A-AH

## 2018-09-27 DIAGNOSIS — Z23 Encounter for immunization: Secondary | ICD-10-CM | POA: Diagnosis not present

## 2018-11-18 DIAGNOSIS — Z6822 Body mass index (BMI) 22.0-22.9, adult: Secondary | ICD-10-CM | POA: Diagnosis not present

## 2018-11-18 DIAGNOSIS — Z1231 Encounter for screening mammogram for malignant neoplasm of breast: Secondary | ICD-10-CM | POA: Diagnosis not present

## 2018-11-18 DIAGNOSIS — Z01419 Encounter for gynecological examination (general) (routine) without abnormal findings: Secondary | ICD-10-CM | POA: Diagnosis not present

## 2019-04-24 ENCOUNTER — Encounter: Payer: Self-pay | Admitting: Internal Medicine

## 2019-05-05 DIAGNOSIS — H25011 Cortical age-related cataract, right eye: Secondary | ICD-10-CM | POA: Diagnosis not present

## 2019-05-05 DIAGNOSIS — H2511 Age-related nuclear cataract, right eye: Secondary | ICD-10-CM | POA: Diagnosis not present

## 2019-05-05 DIAGNOSIS — H43311 Vitreous membranes and strands, right eye: Secondary | ICD-10-CM | POA: Diagnosis not present

## 2019-06-06 DIAGNOSIS — H43311 Vitreous membranes and strands, right eye: Secondary | ICD-10-CM | POA: Diagnosis not present

## 2019-06-06 DIAGNOSIS — H2511 Age-related nuclear cataract, right eye: Secondary | ICD-10-CM | POA: Diagnosis not present

## 2019-06-06 DIAGNOSIS — H25011 Cortical age-related cataract, right eye: Secondary | ICD-10-CM | POA: Diagnosis not present

## 2019-06-27 DIAGNOSIS — M25511 Pain in right shoulder: Secondary | ICD-10-CM | POA: Diagnosis not present

## 2019-06-27 DIAGNOSIS — G8929 Other chronic pain: Secondary | ICD-10-CM | POA: Diagnosis not present

## 2019-10-18 ENCOUNTER — Telehealth: Payer: Self-pay

## 2019-10-18 NOTE — Telephone Encounter (Signed)
Copied from CRM [id number]. Topic: Referral - Question >> Oct 17, 2019  4:44 PM Daphine Deutscher D wrote: Reason for CRM: Pt has been having some sinus issues and wants to know if Dr. Okey Dupre would recommend a specialist.  We discussed insurance and referral and that she probably needs to see Dr. Okey Dupre first.  Pt seemed to just want a recommendation over a referral.  She said she didn't really care about insurance.  She just didn't want to pick anyone.  I did reiterated that she would probably need to have a virtual appt with Dr. Okey Dupre.  [phone]

## 2019-10-18 NOTE — Telephone Encounter (Signed)
   Appointment scheduled for 1/21

## 2019-10-18 NOTE — Telephone Encounter (Signed)
Hasn't had visit since 2019 so would need virtual to discuss.

## 2019-10-26 ENCOUNTER — Ambulatory Visit (INDEPENDENT_AMBULATORY_CARE_PROVIDER_SITE_OTHER): Payer: BC Managed Care – PPO | Admitting: Internal Medicine

## 2019-10-26 DIAGNOSIS — J3089 Other allergic rhinitis: Secondary | ICD-10-CM | POA: Diagnosis not present

## 2019-10-26 MED ORDER — MONTELUKAST SODIUM 10 MG PO TABS: 10.0000 mg | ORAL_TABLET | Freq: Every day | ORAL | 3 refills | Status: DC

## 2019-10-26 NOTE — Progress Notes (Signed)
Virtual Visit via Video Note  I connected with Kim Newton on 10/26/19 at  2:20 PM EST by a video enabled telemedicine application and verified that I am speaking with the correct person using two identifiers.  The patient and the provider were at separate locations throughout the entire encounter.   I discussed the limitations of evaluation and management by telemedicine and the availability of in person appointments. The patient expressed understanding and agreed to proceed. The patient and the provider were the only parties present for the visit unless noted in HPI below.  History of Present Illness: The patient is a 59 y.o. female with visit for chronic sinus concerns. Started years ago. She has been taking generic claritin otc which is not helping. Worse at night time. Overall not worsening lately but she is having a lot of symptoms. Has pressure in the sinuses. Denies fevers or chills.  Observations/Objective: Appearance: norma, breathing appears normal no coughing or dyspnea during visit, casual grooming, abdomen does not appear distended, throat clear drainage, memory normal, mental status is A and O times 3  Assessment and Plan: See problem oriented charting  Follow Up Instructions: rx singulair  I discussed the assessment and treatment plan with the patient. The patient was provided an opportunity to ask questions and all were answered. The patient agreed with the plan and demonstrated an understanding of the instructions.   The patient was advised to call back or seek an in-person evaluation if the symptoms worsen or if the condition fails to improve as anticipated.  Myrlene Broker, MD

## 2019-10-28 ENCOUNTER — Encounter: Payer: Self-pay | Admitting: Internal Medicine

## 2019-10-28 DIAGNOSIS — J309 Allergic rhinitis, unspecified: Secondary | ICD-10-CM | POA: Insufficient documentation

## 2019-10-28 NOTE — Assessment & Plan Note (Signed)
Rx singulair. Does not sound to be covid-19. Encouraged safety measures to avoid getting covid-19. Does not want to try any nasal products or sprays at this time.

## 2019-11-20 ENCOUNTER — Telehealth: Payer: Self-pay

## 2019-11-20 NOTE — Telephone Encounter (Signed)
MD is out of the office will hold for her response whn she return.Marland KitchenRaechel Chute

## 2019-11-20 NOTE — Telephone Encounter (Signed)
Patient calling and states that she was told to call back and let Dr Okey Dupre know how the montelukast (SINGULAIR) 10 MG tablet Was working. States that it's not working like she thinks it should. Would like to know what Dr Okey Dupre would like for her to do?  [id number] - [place], [place] - [address]

## 2019-11-21 NOTE — Telephone Encounter (Signed)
I need more information on what this note means "not working like it should" to address.

## 2019-11-21 NOTE — Telephone Encounter (Signed)
Called pt, she stated that it is not controlling her symptoms at all during the day. She states that she is constantly clogged with mucus and blowing her nose throughout the day. It is only helping a little bit while she is sleeping. She would like for her symptoms to be more controlled during the day.

## 2019-11-22 ENCOUNTER — Telehealth: Payer: Self-pay

## 2019-11-22 DIAGNOSIS — Z01419 Encounter for gynecological examination (general) (routine) without abnormal findings: Secondary | ICD-10-CM | POA: Diagnosis not present

## 2019-11-22 DIAGNOSIS — Z13820 Encounter for screening for osteoporosis: Secondary | ICD-10-CM | POA: Diagnosis not present

## 2019-11-22 DIAGNOSIS — Z1231 Encounter for screening mammogram for malignant neoplasm of breast: Secondary | ICD-10-CM | POA: Diagnosis not present

## 2019-11-22 DIAGNOSIS — Z6822 Body mass index (BMI) 22.0-22.9, adult: Secondary | ICD-10-CM | POA: Diagnosis not present

## 2019-11-22 NOTE — Telephone Encounter (Signed)
Called pt, Kim Newton.   

## 2019-11-22 NOTE — Telephone Encounter (Signed)
New message    The patient calling back to speak with the nurse.

## 2019-11-22 NOTE — Telephone Encounter (Signed)
I would recommend to try xyzal otc in the morning. Keep singulair at night time if helping for night time symptoms (which were worse at last visit than daytime symptoms).

## 2019-11-24 NOTE — Telephone Encounter (Signed)
See other TE.

## 2019-11-24 NOTE — Telephone Encounter (Signed)
Pt called back wanted to know the difference between Xyzal and singular which I did explain to the patient and she states she use to take generic benadryl and it did not help her and would like to know what the difference is between the benadryl and xyzal and why she soul take that medication.

## 2019-11-24 NOTE — Telephone Encounter (Signed)
Benadryl is an antihistamine and does cover up symptoms and not stop drainage. Xyzal is meant to stop drainage so that symptoms are not present.

## 2019-11-24 NOTE — Telephone Encounter (Signed)
Pt.notified

## 2019-11-24 NOTE — Telephone Encounter (Signed)
    Please return call to patient to discuss Dr Lawana Chambers recommendation for Xyzal

## 2019-12-08 DIAGNOSIS — Z23 Encounter for immunization: Secondary | ICD-10-CM | POA: Diagnosis not present

## 2020-01-01 NOTE — Telephone Encounter (Signed)
   Patient calling to report allergies are not any better. Declined to schedule appointment at this time.

## 2020-01-08 NOTE — Telephone Encounter (Signed)
New message:   Pt is calling back due to her allergies and states she never received a call back from her phone call on 01/01/20. I have advised the patient that Dr. Okey Dupre will be back on 01/10/20. She ask is it possible for another doctor to send her something in. Please advise.

## 2020-01-09 MED ORDER — FLUTICASONE PROPIONATE 50 MCG/ACT NA SUSP: 2.0000 | Freq: Every day | NASAL | 0 refills | Status: DC

## 2020-01-09 NOTE — Addendum Note (Signed)
Addended by: Eustace Moore on: 01/09/2020 10:13 AM   Modules accepted: Orders

## 2020-01-09 NOTE — Telephone Encounter (Signed)
I will call in Flonase for her to use in addition to her Xyzal and Singulair. She needs a follow-up visit with Dr. Okey Dupre in the next 1-2 weeks to discuss response. I would schedule virtual initially and let Dr. Okey Dupre decide if she wants to do in office.

## 2020-01-17 ENCOUNTER — Telehealth: Payer: Self-pay | Admitting: Internal Medicine

## 2020-01-17 NOTE — Telephone Encounter (Signed)
    Pt c/o medication issue:  1. Name of Medication: fluticasone (FLONASE) 50 MCG/ACT nasal spray  2. How are you currently taking this medication (dosage and times per day)? n/a  3. Are you having a reaction (difficulty breathing--STAT)? No 4. What is your medication issue? Patient declined to use spray, states she will not be spraying anything up her nose. Requesting call from Dr Okey Dupre

## 2020-01-18 ENCOUNTER — Other Ambulatory Visit: Payer: Self-pay | Admitting: Family

## 2020-01-18 NOTE — Telephone Encounter (Signed)
Prescription sent to pharmacy.

## 2020-06-17 ENCOUNTER — Telehealth: Payer: Self-pay | Admitting: Podiatry

## 2020-06-17 NOTE — Telephone Encounter (Signed)
Patient called and has bruising under toenail from extended walking in summer. Advised that this can take several months, if not better in 1 month she should schedule appt for eval

## 2020-07-05 DIAGNOSIS — J3089 Other allergic rhinitis: Secondary | ICD-10-CM | POA: Diagnosis not present

## 2020-07-05 DIAGNOSIS — J301 Allergic rhinitis due to pollen: Secondary | ICD-10-CM | POA: Diagnosis not present

## 2020-07-05 DIAGNOSIS — H1045 Other chronic allergic conjunctivitis: Secondary | ICD-10-CM | POA: Diagnosis not present

## 2020-07-05 DIAGNOSIS — J300 Vasomotor rhinitis: Secondary | ICD-10-CM | POA: Diagnosis not present

## 2020-09-02 DIAGNOSIS — Z1159 Encounter for screening for other viral diseases: Secondary | ICD-10-CM | POA: Diagnosis not present

## 2020-09-04 DIAGNOSIS — K644 Residual hemorrhoidal skin tags: Secondary | ICD-10-CM | POA: Diagnosis not present

## 2020-09-04 DIAGNOSIS — Z1211 Encounter for screening for malignant neoplasm of colon: Secondary | ICD-10-CM | POA: Diagnosis not present

## 2020-09-04 DIAGNOSIS — Z800 Family history of malignant neoplasm of digestive organs: Secondary | ICD-10-CM | POA: Diagnosis not present

## 2020-09-04 DIAGNOSIS — K648 Other hemorrhoids: Secondary | ICD-10-CM | POA: Diagnosis not present

## 2020-09-04 DIAGNOSIS — K635 Polyp of colon: Secondary | ICD-10-CM | POA: Diagnosis not present

## 2020-09-04 DIAGNOSIS — K621 Rectal polyp: Secondary | ICD-10-CM | POA: Diagnosis not present

## 2020-10-09 ENCOUNTER — Encounter: Payer: Self-pay | Admitting: Interventional Cardiology

## 2020-10-09 ENCOUNTER — Ambulatory Visit (INDEPENDENT_AMBULATORY_CARE_PROVIDER_SITE_OTHER): Payer: BC Managed Care – PPO

## 2020-10-09 ENCOUNTER — Ambulatory Visit: Payer: BC Managed Care – PPO | Admitting: Interventional Cardiology

## 2020-10-09 ENCOUNTER — Other Ambulatory Visit: Payer: Self-pay

## 2020-10-09 ENCOUNTER — Encounter: Payer: Self-pay | Admitting: Radiology

## 2020-10-09 VITALS — BP 110/70 | HR 87 | Ht 61.0 in | Wt 125.2 lb

## 2020-10-09 DIAGNOSIS — I493 Ventricular premature depolarization: Secondary | ICD-10-CM

## 2020-10-09 DIAGNOSIS — E785 Hyperlipidemia, unspecified: Secondary | ICD-10-CM | POA: Diagnosis not present

## 2020-10-09 DIAGNOSIS — Z7189 Other specified counseling: Secondary | ICD-10-CM | POA: Diagnosis not present

## 2020-10-09 DIAGNOSIS — R002 Palpitations: Secondary | ICD-10-CM | POA: Diagnosis not present

## 2020-10-09 DIAGNOSIS — Z131 Encounter for screening for diabetes mellitus: Secondary | ICD-10-CM | POA: Diagnosis not present

## 2020-10-09 NOTE — Patient Instructions (Signed)
Medication Instructions:  Your physician recommends that you continue on your current medications as directed. Please refer to the Current Medication list given to you today.  *If you need a refill on your cardiac medications before your next appointment, please call your pharmacy*   Lab Work: BMET, Liver, A1C, Magnesium and TSH today  If you have labs (blood work) drawn today and your tests are completely normal, you will receive your results only by: Marland Kitchen MyChart Message (if you have MyChart) OR . A paper copy in the mail If you have any lab test that is abnormal or we need to change your treatment, we will call you to review the results.   Testing/Procedures: Your physician recommends that you wear a monitor for 2 weeks.    Follow-Up: At [facility], you and your health needs are our priority.  As part of our continuing mission to provide you with exceptional heart care, we have created designated Provider Care Teams.  These Care Teams include your primary Cardiologist (physician) and Advanced Practice Providers (APPs -  Physician Assistants and Nurse Practitioners) who all work together to provide you with the care you need, when you need it.  We recommend signing up for the patient portal called "MyChart".  Sign up information is provided on this After Visit Summary.  MyChart is used to connect with patients for Virtual Visits (Telemedicine).  Patients are able to view lab/test results, encounter notes, upcoming appointments, etc.  Non-urgent messages can be sent to your provider as well.   To learn more about what you can do with MyChart, go to [email or web address].    Your next appointment:   As needed  The format for your next appointment:   In Person  Provider:   You may see Dr. Verdis Prime or one of the following Advanced Practice Providers on your designated Care Team:    Norma Fredrickson, NP  Nada Boozer, NP  Georgie Chard, NP    Other Instructions  Christena Deem-  Long Term Monitor Instructions   Your physician has requested you wear your ZIO patch monitor 14 days.   This is a single patch monitor.  [employer] supplies one patch monitor per enrollment.  Additional stickers are not available.   Please do not apply patch if you will be having a Nuclear Stress Test, Echocardiogram, Cardiac CT, MRI, or Chest Xray during the time frame you would be wearing the monitor. The patch cannot be worn during these tests.  You cannot remove and re-apply the ZIO XT patch monitor.   Your ZIO patch monitor will be sent USPS Priority mail from [facility] directly to your home address. The monitor may also be mailed to a PO BOX if home delivery is not available.   It may take 3-5 days to receive your monitor after you have been enrolled.   Once you have received you monitor, please review enclosed instructions.  Your monitor has already been registered assigning a specific monitor serial # to you.   Applying the monitor   Shave hair from upper left chest.   Hold abrader disc by orange tab.  Rub abrader in 40 strokes over left upper chest as indicated in your monitor instructions.   Clean area with 4 enclosed alcohol pads .  Use all pads to assure are is cleaned thoroughly.  Let dry.   Apply patch as indicated in monitor instructions.  Patch will be place under collarbone on left side of chest with arrow pointing upward.  Rub patch adhesive wings for 2 minutes.Remove white label marked "1".  Remove white label marked "2".  Rub patch adhesive wings for 2 additional minutes.   While looking in a mirror, press and release button in center of patch.  A small green light will flash 3-4 times .  This will be your only indicator the monitor has been turned on.     Do not shower for the first 24 hours.  You may shower after the first 24 hours.   Press button if you feel a symptom. You will hear a small click.  Record Date, Time and Symptom in the Patient Log Book.    When you are ready to remove patch, follow instructions on last 2 pages of Patient Log Book.  Stick patch monitor onto last page of Patient Log Book.   Place Patient Log Book in [place] box.  Use locking tab on box and tape box closed securely.  The [employer] and [employer] has [employer] on it.  Please place in mailbox as soon as possible.  Your physician should have your test results approximately 7 days after the monitor has been mailed back to [facility].   Call [facility] Customer Care at [phone] if you have questions regarding your ZIO XT patch monitor.  Call them immediately if you see an orange light blinking on your monitor.   If your monitor falls off in less than 4 days contact our Monitor department at [phone].  If your monitor becomes loose or falls off after 4 days call [employer] at [phone] for suggestions on securing your monitor.

## 2020-10-09 NOTE — Progress Notes (Signed)
Enrolled patient for a 14 day Zio XT  monitor to be mailed to patients home  °

## 2020-10-09 NOTE — Progress Notes (Signed)
Cardiology Office Note:    Date:  10/09/2020   ID:  Kim Newton, DOB 09-Aug-1961, MRN [id number]  PCP:  Linda Hedges, DO  Cardiologist:  No primary care provider on file.   Referring MD: Ronnette Juniper, MD   Chief Complaint  Patient presents with  . Irregular Heart Beat    Premature ventricular contractions    History of Present Illness:    Kim Newton is a 60 y.o. female with a hx of abnormal EKG and premature ventricular contractions.  Referred by Dr. Therisa Doyne.  Kim Newton was noted to have irregular heartbeat during a colonoscopy. She was told that she is having PVCs. She has not had syncope. She denies palpitations. Though the PVCs have been found, she is totally asymptomatic. She denies exogenous sources of sympathomimetics. She does not drink alcohol on a daily basis. She uses no over-the-counter nasal sprays. Caffeine is not a significant issue. She does drink alcohol socially, on weekends. She still works. Her husband states that she is sedentary.  She has no family history of heart disease or personal history of diabetes, tobacco use, or hypertension. She does have history of sinusitis. She uses Singulair. She has a longstanding history of elevated lipids.  Past Medical History:  Diagnosis Date  . Anal fistula   . History of rectal abscess   . Hyperlipidemia   . Wears glasses     Past Surgical History:  Procedure Laterality Date  . CESAREAN SECTION  x2 last one 1995  . REPAIR UMBILICAL AND VENTRAL HERNIA'S  01-29-2004    dr gerkin  [facility]    Current Medications: Current Meds  Medication Sig  . CLIMARA 0.0375 MG/24HR patch   . fluticasone (FLONASE) 50 MCG/ACT nasal spray SPRAY 2 SPRAYS INTO EACH NOSTRIL EVERY DAY  . levocetirizine (XYZAL) 5 MG tablet Take 5 mg by mouth at bedtime.  . montelukast (SINGULAIR) 10 MG tablet Take 1 tablet (10 mg total) by mouth at bedtime.  . Multiple Vitamin (MULTIVITAMIN) tablet Take 1 tablet by mouth daily.  . progesterone  (PROMETRIUM) 100 MG capsule Take 100 mg by mouth daily.     Allergies:   Patient has no known allergies.   Social History   Socioeconomic History  . Marital status: Married    Spouse name: Not on file  . Number of children: Not on file  . Years of education: Not on file  . Highest education level: Not on file  Occupational History  . Not on file  Tobacco Use  . Smoking status: Never Smoker  . Smokeless tobacco: Never Used  Substance and Sexual Activity  . Alcohol use: Yes    Alcohol/week: 2.0 standard drinks    Types: 2 Glasses of wine per week    Comment: occasional  . Drug use: Not on file  . Sexual activity: Not on file  Other Topics Concern  . Not on file  Social History Narrative  . Not on file   Social Determinants of Health   Financial Resource Strain: Not on file  Food Insecurity: Not on file  Transportation Needs: Not on file  Physical Activity: Not on file  Stress: Not on file  Social Connections: Not on file     Family History: The patient's family history includes Colon cancer (age of onset: 26) in her mother; Myasthenia gravis in her father.  ROS:   Please see the history of present illness.    History of pituitary gland problem in the remote past. She  does not remember anything about the diagnosis. All other systems reviewed and are negative.  EKGs/Labs/Other Studies Reviewed:    The following studies were reviewed today: Laboratory data:  LDL cholesterol 150 10 October 2017. No other laboratory data is available.  EKG:  EKG normal sinus rhythm, incomplete right bundle, borderline first-degree AV block, right atrial abnormality, isolated uniform PVCs.  Recent Labs: No results found for requested labs within last 8760 hours.  Recent Lipid Panel    Component Value Date/Time   CHOL 269 (A) 10/19/2017 0000   TRIG 69 10/19/2017 0000   HDL 97 (A) 10/19/2017 0000   LDLCALC 156 10/19/2017 0000    Physical Exam:    VS:  BP 110/70   Pulse 87    Ht 5\' 1"  (1.549 m)   Wt 125 lb 3.2 oz (56.8 kg)   LMP 12/03/2011   SpO2 98%   BMI 23.66 kg/m     Wt Readings from Last 3 Encounters:  10/09/20 125 lb 3.2 oz (56.8 kg)  11/23/17 120 lb (54.4 kg)     GEN: Healthy-appearing, BMI 24 mg/m. No acute distress HEENT: Normal NECK: No JVD. LYMPHATICS: No lymphadenopathy CARDIAC: No murmur. RRR no gallop, or edema. VASCULAR:  Normal Pulses. No bruits. RESPIRATORY:  Clear to auscultation without rales, wheezing or rhonchi  ABDOMEN: Soft, non-tender, non-distended, No pulsatile mass, MUSCULOSKELETAL: No deformity  SKIN: Warm and dry NEUROLOGIC:  Alert and oriented x 3 PSYCHIATRIC:  Normal affect   ASSESSMENT:    1. Premature ventricular contractions   2. Hyperlipidemia, unspecified hyperlipidemia type   3. Diabetes mellitus screening   4. Educated about COVID-19 virus infection    PLAN:    In order of problems listed above:  1. Continuous monitor for least 14 days to quantitate PVC burden, rule out more malignant arrhythmia and to exclude atrial fibrillation which both she and her husband had concern about based upon the apple watch diagnosis. Additional work-up will include TSH, calcium, potassium, magnesium. 2. LDL cholesterol greater than 150 with associated elevated high density lipoprotein. Laboratory work-up to get a better handle on risk will be hemoglobin A1c. 3. Hemoglobin A1c 4. Practicing social mitigation. No COVID-19 infection. Vaccinated and boosted.   Medication Adjustments/Labs and Tests Ordered: Current medicines are reviewed at length with the patient today.  Concerns regarding medicines are outlined above.  Orders Placed This Encounter  Procedures  . Hepatic function panel  . Basic metabolic panel  . Magnesium  . HgB A1c  . TSH  . LONG TERM MONITOR (3-14 DAYS)  . EKG 12-Lead   No orders of the defined types were placed in this encounter.   Patient Instructions  Medication Instructions:  Your physician  recommends that you continue on your current medications as directed. Please refer to the Current Medication list given to you today.  *If you need a refill on your cardiac medications before your next appointment, please call your pharmacy*   Lab Work: BMET, Liver, A1C, Magnesium and TSH today  If you have labs (blood work) drawn today and your tests are completely normal, you will receive your results only by: 11/25/17 MyChart Message (if you have MyChart) OR . A paper copy in the mail If you have any lab test that is abnormal or we need to change your treatment, we will call you to review the results.   Testing/Procedures: Your physician recommends that you wear a monitor for 2 weeks.    Follow-Up: At [facility], you and your  health needs are our priority.  As part of our continuing mission to provide you with exceptional heart care, we have created designated Provider Care Teams.  These Care Teams include your primary Cardiologist (physician) and Advanced Practice Providers (APPs -  Physician Assistants and Nurse Practitioners) who all work together to provide you with the care you need, when you need it.  We recommend signing up for the patient portal called "MyChart".  Sign up information is provided on this After Visit Summary.  MyChart is used to connect with patients for Virtual Visits (Telemedicine).  Patients are able to view lab/test results, encounter notes, upcoming appointments, etc.  Non-urgent messages can be sent to your provider as well.   To learn more about what you can do with MyChart, go to [email or web address].    Your next appointment:   As needed  The format for your next appointment:   In Person  Provider:   You may see Dr. Verdis Prime or one of the following Advanced Practice Providers on your designated Care Team:    Norma Fredrickson, NP  Nada Boozer, NP  Georgie Chard, NP    Other Instructions  Christena Deem- Long Term Monitor Instructions   Your  physician has requested you wear your ZIO patch monitor 14 days.   This is a single patch monitor.  [employer] supplies one patch monitor per enrollment.  Additional stickers are not available.   Please do not apply patch if you will be having a Nuclear Stress Test, Echocardiogram, Cardiac CT, MRI, or Chest Xray during the time frame you would be wearing the monitor. The patch cannot be worn during these tests.  You cannot remove and re-apply the ZIO XT patch monitor.   Your ZIO patch monitor will be sent USPS Priority mail from [facility] directly to your home address. The monitor may also be mailed to a PO BOX if home delivery is not available.   It may take 3-5 days to receive your monitor after you have been enrolled.   Once you have received you monitor, please review enclosed instructions.  Your monitor has already been registered assigning a specific monitor serial # to you.   Applying the monitor   Shave hair from upper left chest.   Hold abrader disc by orange tab.  Rub abrader in 40 strokes over left upper chest as indicated in your monitor instructions.   Clean area with 4 enclosed alcohol pads .  Use all pads to assure are is cleaned thoroughly.  Let dry.   Apply patch as indicated in monitor instructions.  Patch will be place under collarbone on left side of chest with arrow pointing upward.   Rub patch adhesive wings for 2 minutes.Remove white label marked "1".  Remove white label marked "2".  Rub patch adhesive wings for 2 additional minutes.   While looking in a mirror, press and release button in center of patch.  A small green light will flash 3-4 times .  This will be your only indicator the monitor has been turned on.     Do not shower for the first 24 hours.  You may shower after the first 24 hours.   Press button if you feel a symptom. You will hear a small click.  Record Date, Time and Symptom in the Patient Log Book.   When you are ready to remove patch,  follow instructions on last 2 pages of Patient Log Book.  Stick patch monitor onto last page of  Patient Log Book.   Place Patient Log Book in [place] box.  Use locking tab on box and tape box closed securely.  The [employer] and [employer] has [employer] on it.  Please place in mailbox as soon as possible.  Your physician should have your test results approximately 7 days after the monitor has been mailed back to [facility].   Call [facility] Customer Care at [phone] if you have questions regarding your ZIO XT patch monitor.  Call them immediately if you see an orange light blinking on your monitor.   If your monitor falls off in less than 4 days contact our Monitor department at [phone].  If your monitor becomes loose or falls off after 4 days call [employer] at [phone] for suggestions on securing your monitor.       Signed, Lesleigh Noe, MD  10/09/2020 3:55 PM    [facility]

## 2020-10-10 ENCOUNTER — Telehealth: Payer: Self-pay | Admitting: *Deleted

## 2020-10-10 DIAGNOSIS — E785 Hyperlipidemia, unspecified: Secondary | ICD-10-CM

## 2020-10-10 LAB — BASIC METABOLIC PANEL
BUN/Creatinine Ratio: 12 (ref 12–28)
BUN: 11 mg/dL (ref 8–27)
CO2: 26 mmol/L (ref 20–29)
Calcium: 9.3 mg/dL (ref 8.7–10.3)
Chloride: 103 mmol/L (ref 96–106)
Creatinine, Ser: 0.94 mg/dL (ref 0.57–1.00)
GFR calc Af Amer: 76 mL/min/{1.73_m2} (ref >59)
GFR calc non Af Amer: 66 mL/min/{1.73_m2} (ref >59)
Glucose: 81 mg/dL (ref 65–99)
Potassium: 4.6 mmol/L (ref 3.5–5.2)
Sodium: 140 mmol/L (ref 134–144)

## 2020-10-10 LAB — MAGNESIUM: Magnesium: 2.2 mg/dL (ref 1.6–2.3)

## 2020-10-10 LAB — HEPATIC FUNCTION PANEL
ALT: 9 IU/L (ref 0–32)
AST: 15 IU/L (ref 0–40)
Albumin: 4.3 g/dL (ref 3.8–4.8)
Alkaline Phosphatase: 46 IU/L (ref 44–121)
Bilirubin Total: 0.4 mg/dL (ref 0.0–1.2)
Bilirubin, Direct: 0.12 mg/dL (ref 0.00–0.40)
Total Protein: 6.9 g/dL (ref 6.0–8.5)

## 2020-10-10 LAB — TSH: TSH: 1.33 u[IU]/mL (ref 0.450–4.500)

## 2020-10-10 LAB — HEMOGLOBIN A1C
Est. average glucose Bld gHb Est-mCnc: 105 mg/dL
Hgb A1c MFr Bld: 5.3 % (ref 4.8–5.6)

## 2020-10-10 NOTE — Telephone Encounter (Signed)
Spoke with pt and reviewed results and recommendations per Dr. Katrinka Blazing.  Pt agreeable to Calcium Score.  Advised I will place order and CT dept will be in contact to get her scheduled.  Pt appreciative for call.

## 2020-10-10 NOTE — Telephone Encounter (Signed)
-----   Message from Lyn Records, MD sent at 10/10/2020  9:09 AM EST ----- Let the patient know the potassium, calcium, magnesium, and thyroid studies are all normal and therefore not contributing to the PVCs.  There is no evidence of diabetes/glucose intolerance.  With normal labs and only abnormality being severely elevated LDL cholesterol, we should do a coronary calcium score which will help decide if there is hidden risk of vascular events.  If the calcium score is elevated, cholesterol treatment will help to decrease risk.  If calcium score is 0, cholesterol would not need to be treated. A copy will be sent to Mitchel Honour, DO

## 2020-10-11 LAB — SPECIMEN STATUS REPORT

## 2020-10-11 LAB — LIPID PANEL
Chol/HDL Ratio: 3.4 ratio (ref 0.0–4.4)
Cholesterol, Total: 269 mg/dL — ABNORMAL HIGH (ref 100–199)
HDL: 80 mg/dL (ref >39)
LDL Chol Calc (NIH): 163 mg/dL — ABNORMAL HIGH (ref 0–99)
Triglycerides: 145 mg/dL (ref 0–149)
VLDL Cholesterol Cal: 26 mg/dL (ref 5–40)

## 2020-10-14 ENCOUNTER — Ambulatory Visit: Payer: BC Managed Care – PPO | Admitting: Interventional Cardiology

## 2020-10-18 ENCOUNTER — Ambulatory Visit (INDEPENDENT_AMBULATORY_CARE_PROVIDER_SITE_OTHER)
Admission: RE | Admit: 2020-10-18 | Discharge: 2020-10-18 | Disposition: A | Discharge status: Home / Self Care | Payer: Self-pay | Source: Ambulatory Visit | Attending: Interventional Cardiology | Admitting: Interventional Cardiology

## 2020-10-18 ENCOUNTER — Other Ambulatory Visit: Payer: Self-pay

## 2020-10-18 DIAGNOSIS — E785 Hyperlipidemia, unspecified: Secondary | ICD-10-CM

## 2020-10-25 DIAGNOSIS — Z1159 Encounter for screening for other viral diseases: Secondary | ICD-10-CM | POA: Diagnosis not present

## 2020-11-06 DIAGNOSIS — R002 Palpitations: Secondary | ICD-10-CM | POA: Diagnosis not present

## 2020-11-13 DIAGNOSIS — L820 Inflamed seborrheic keratosis: Secondary | ICD-10-CM | POA: Diagnosis not present

## 2020-11-18 ENCOUNTER — Other Ambulatory Visit: Payer: Self-pay | Admitting: *Deleted

## 2020-11-18 DIAGNOSIS — I493 Ventricular premature depolarization: Secondary | ICD-10-CM

## 2020-11-19 ENCOUNTER — Ambulatory Visit: Payer: BC Managed Care – PPO | Admitting: Cardiology

## 2020-11-19 ENCOUNTER — Other Ambulatory Visit: Payer: Self-pay

## 2020-11-19 VITALS — BP 102/60 | Ht 61.0 in | Wt 124.0 lb

## 2020-11-19 DIAGNOSIS — I493 Ventricular premature depolarization: Secondary | ICD-10-CM

## 2020-11-19 NOTE — Progress Notes (Signed)
Electrophysiology Office Note:    Date:  11/19/2020   ID:  Kim Newton, DOB 11-06-1960, MRN [id number]  PCP:  Kim Honour, DO  [facility] Cardiologist:  Kim Noe, MD  [facility] Electrophysiologist:  Lanier Prude, MD   Referring MD: Kim Records, MD   Chief Complaint: PVCs  History of Present Illness:    Kim Newton is a 60 y.o. female who presents for an evaluation of PVCs at the request of Kim. Katrinka Newton. Their medical history includes hyperlipidemia.  Patient was last seen by Kim. Katrinka Newton October 09, 2020.  The patient was noted to have an irregular heartbeat during a colonoscopy.  According to his office note, the patient was asymptomatic from her PVCs.  At his appointment, he ordered a 14-day monitor to quantify her PVC burden.  Today, the patient tells me that she is completely asymptomatic with her PVCs. She is active and has recently started exercising again. She is using an elliptical machine. No history of syncope or presyncope. No family history of premature cardiovascular death, SCD, defibrillators or drowning.    Past Medical History:  Diagnosis Date  . Anal fistula   . History of rectal abscess   . Hyperlipidemia   . Wears glasses     Past Surgical History:  Procedure Laterality Date  . CESAREAN SECTION  x2 last one 1995  . REPAIR UMBILICAL AND VENTRAL HERNIA'S  01-29-2004    Kim Newton  [facility]    Current Medications: Current Meds  Medication Sig  . estradiol (CLIMARA - DOSED IN MG/24 HR) 0.075 mg/24hr patch Place 0.075 mg onto the skin once a week.  . fluticasone (FLONASE) 50 MCG/ACT nasal spray SPRAY 2 SPRAYS INTO EACH NOSTRIL EVERY DAY  . levocetirizine (XYZAL) 5 MG tablet Take 5 mg by mouth at bedtime.  . montelukast (SINGULAIR) 10 MG tablet Take 1 tablet (10 mg total) by mouth at bedtime.  . Multiple Vitamin (MULTIVITAMIN) tablet Take 1 tablet by mouth daily.  . progesterone (PROMETRIUM) 100 MG capsule Take 100 mg by mouth at bedtime.   . [DISCONTINUED] CLIMARA 0.0375 MG/24HR patch   . [DISCONTINUED] progesterone (PROMETRIUM) 100 MG capsule Take 100 mg by mouth daily.     Allergies:   Patient has no known allergies.   Social History   Socioeconomic History  . Marital status: Married    Spouse name: Not on file  . Number of children: Not on file  . Years of education: Not on file  . Highest education level: Not on file  Occupational History  . Not on file  Tobacco Use  . Smoking status: Never Smoker  . Smokeless tobacco: Never Used  Substance and Sexual Activity  . Alcohol use: Yes    Alcohol/week: 2.0 standard drinks    Types: 2 Glasses of wine per week    Comment: occasional  . Drug use: Not on file  . Sexual activity: Not on file  Other Topics Concern  . Not on file  Social History Narrative  . Not on file   Social Determinants of Health   Financial Resource Strain: Not on file  Food Insecurity: Not on file  Transportation Needs: Not on file  Physical Activity: Not on file  Stress: Not on file  Social Connections: Not on file     Family History: The patient's family history includes Colon cancer (age of onset: 55) in her mother; Myasthenia gravis in her father.  ROS:   Please see the  history of present illness.    All other systems reviewed and are negative.  EKGs/Labs/Other Studies Reviewed:    The following studies were reviewed today:  November 18, 2020 ZIO personally reviewed 22% burden of PVCs 4 beat salvo VT at a heart rate of 187 bpm One dominant morphology of PVC accounting for 21.7% of her ectopy   October 09, 2020 EKG Sinus rhythm with frequent monomorphic PVCs.  PVC has a steeply inferior axis with a precordial transition in V2.  The PVC is approximately 140 Kim in duration.       October 18, 2020 CT cardiac scoring Coronary artery calcium score of 0     EKG:  The ekg ordered today demonstrates sinus rhythm with frequent PVCs. Rhythm strip documents bigeminal PVCs.  PVCs are monomorphic with a steeply inferior axis and precordial transition in V1/V2. The PVC has a slurred upstroke.   Recent Labs: 10/09/2020: ALT 9; BUN 11; Creatinine, Ser 0.94; Magnesium 2.2; Potassium 4.6; Sodium 140; TSH 1.330  Recent Lipid Panel    Component Value Date/Time   CHOL 269 (H) 10/09/2020 1520   TRIG 145 10/09/2020 1520   HDL 80 10/09/2020 1520   CHOLHDL 3.4 10/09/2020 1520   LDLCALC 163 (H) 10/09/2020 1520    Physical Exam:    VS:  BP 102/60   Ht 5\' 1"  (1.549 m)   Wt 124 lb (56.2 kg)   LMP 12/03/2011   SpO2 97%   BMI 23.43 kg/m     Wt Readings from Last 3 Encounters:  11/19/20 124 lb (56.2 kg)  10/09/20 125 lb 3.2 oz (56.8 kg)  11/23/17 120 lb (54.4 kg)     GEN:  Well nourished, well developed in no acute distress HEENT: Normal NECK: No JVD; No carotid bruits LYMPHATICS: No lymphadenopathy CARDIAC: Irregular rhythm, no murmurs, rubs, gallops RESPIRATORY:  Clear to auscultation without rales, wheezing or rhonchi  ABDOMEN: Soft, non-tender, non-distended MUSCULOSKELETAL:  No edema; No deformity  SKIN: Warm and dry NEUROLOGIC:  Alert and oriented x 3 PSYCHIATRIC:  Normal affect   ASSESSMENT:    1. PVC (premature ventricular contraction)    PLAN:    In order of problems listed above:  Patient with very frequent asymptomatic monomorphic PVCs. The PVCs appear to originate near the [facility] or potentially just beneath the aortic valve within the LVOT. I am reassured by her being asymptomatic although I do think we need to assess the LV function to confirm her LV function is stable. She is also in the age bracket where we frequently see cardiac sarcoidosis. I will order a cardiac MRI to investigate this possibility. I discussed these tests with the patient today and she is agreeable. We discussed the possibility that these tests would be normal. If this is the case, will plan on annual follow up with echo to confirm LV function remains stable in the setting of  frequent PVCs. If there is evidence of LV dysfunction will need to discuss PVC suppression in more detail.   In the meantime I have encouraged Kim [facility] to remain active.   Follow up in 6-8 weeks to discuss above results.  Medication Adjustments/Labs and Tests Ordered: Current medicines are reviewed at length with the patient today.  Concerns regarding medicines are outlined above.  Orders Placed This Encounter  Procedures  . MR Card Morphology Wo/W Cm  . EKG 12-Lead  . ECHOCARDIOGRAM COMPLETE   No orders of the defined types were placed in this encounter.  Signed, Steffanie Dunn, MD, [facility]  11/19/2020 6:19 PM    Electrophysiology [facility]

## 2020-11-19 NOTE — Patient Instructions (Addendum)
Medication Instructions:  Your physician recommends that you continue on your current medications as directed. Please refer to the Current Medication list given to you today.  Labwork: None ordered.  Testing/Procedures: Your physician has requested that you have an echocardiogram. Echocardiography is a painless test that uses sound waves to create images of your heart. It provides your doctor with information about the size and shape of your heart and how well your heart's chambers and valves are working. This procedure takes approximately one hour. There are no restrictions for this procedure.  Please schedule for ECHO  You are scheduled for Cardiac MRI on _____________. Please arrive at the [facility] main entrance of [facility] at _______________ (30-45 minutes prior to test start time). ?  [facility]  [address]  [place], [place] [address]  [phone]  Proceed to the [facility] Radiology Department (First Floor).  ?  Magnetic resonance imaging (MRI) is a painless test that produces images of the inside of the body without using X-rays. During an MRI, strong magnets and radio waves work together in a Data processing manager to form detailed images. MRI images may provide more details about a medical condition than X-rays, CT scans, and ultrasounds can provide.  You may be given earphones to listen for instructions.  You may eat a light breakfast and take medications as ordered. If a contrast material will be used, an IV will be inserted into one of your veins. Contrast material will be injected into your IV.  You will be asked to remove all metal, including: Watch, jewelry, and other metal objects including hearing aids, hair pieces and dentures. (Braces and fillings normally are not a problem.)  If contrast material was used:  It will leave your body through your urine within a day. You may be told to drink plenty of fluids to help flush the contrast material out of  your system.  TEST WILL TAKE APPROXIMATELY 1 HOUR  PLEASE NOTIFY SCHEDULING AT LEAST 24 HOURS IN ADVANCE IF YOU ARE UNABLE TO KEEP YOUR APPOINTMENT.    Follow-Up: Your physician wants you to follow-up in: 6 weeks with Dr. Lalla Brothers.    Any Other Special Instructions Will Be Listed Below (If Applicable).  If you need a refill on your cardiac medications before your next appointment, please call your pharmacy.

## 2020-11-21 ENCOUNTER — Telehealth: Payer: Self-pay | Admitting: Cardiology

## 2020-11-21 NOTE — Telephone Encounter (Signed)
Left message for patient to call and discuss preferred weekdays and times for scheduling the cardiac mri ordered by Dr. Lalla Brothers

## 2020-11-25 ENCOUNTER — Encounter: Payer: Self-pay | Admitting: Cardiology

## 2020-11-25 NOTE — Telephone Encounter (Signed)
Left message for patient regarding 12/23/20 Cardiac MRI scheduled appointment---arrival time is 7:30 am 1st floor admissions office at [facility] ar an 8:00 am start time.  Will mail information to patient and it is also in My Chart.  Requested patient call with questions or concerns.

## 2020-11-26 DIAGNOSIS — Z1231 Encounter for screening mammogram for malignant neoplasm of breast: Secondary | ICD-10-CM | POA: Diagnosis not present

## 2020-11-26 DIAGNOSIS — Z6823 Body mass index (BMI) 23.0-23.9, adult: Secondary | ICD-10-CM | POA: Diagnosis not present

## 2020-11-26 DIAGNOSIS — Z01419 Encounter for gynecological examination (general) (routine) without abnormal findings: Secondary | ICD-10-CM | POA: Diagnosis not present

## 2020-11-28 ENCOUNTER — Encounter: Payer: Self-pay | Admitting: Cardiology

## 2020-11-28 ENCOUNTER — Telehealth: Payer: Self-pay | Admitting: Cardiology

## 2020-11-28 NOTE — Telephone Encounter (Signed)
Spoke with patient regarding the 01/07/21 12:00 pm Cardiac MRI appointment at [facility] time is 11:30 am--1st floor admissions office for check in.  Will mail information to patient and it is available in My Chart.  Patient voiced her understanding

## 2020-12-02 ENCOUNTER — Other Ambulatory Visit: Payer: Self-pay | Admitting: Obstetrics & Gynecology

## 2020-12-02 DIAGNOSIS — R928 Other abnormal and inconclusive findings on diagnostic imaging of breast: Secondary | ICD-10-CM

## 2020-12-04 ENCOUNTER — Other Ambulatory Visit: Payer: Self-pay

## 2020-12-04 ENCOUNTER — Ambulatory Visit
Admission: RE | Admit: 2020-12-04 | Discharge: 2020-12-04 | Disposition: A | Discharge status: Home / Self Care | Payer: BC Managed Care – PPO | Source: Ambulatory Visit | Attending: Obstetrics & Gynecology | Admitting: Obstetrics & Gynecology

## 2020-12-04 DIAGNOSIS — R928 Other abnormal and inconclusive findings on diagnostic imaging of breast: Secondary | ICD-10-CM

## 2020-12-04 DIAGNOSIS — R922 Inconclusive mammogram: Secondary | ICD-10-CM | POA: Diagnosis not present

## 2020-12-12 ENCOUNTER — Ambulatory Visit (HOSPITAL_COMMUNITY): Payer: BC Managed Care – PPO | Attending: Cardiology

## 2020-12-12 ENCOUNTER — Other Ambulatory Visit: Payer: Self-pay

## 2020-12-12 DIAGNOSIS — I493 Ventricular premature depolarization: Secondary | ICD-10-CM | POA: Diagnosis not present

## 2020-12-12 LAB — ECHOCARDIOGRAM COMPLETE
Area-P 1/2: 3.03 cm2
S' Lateral: 2.7 cm

## 2020-12-13 DIAGNOSIS — L811 Chloasma: Secondary | ICD-10-CM | POA: Diagnosis not present

## 2020-12-23 ENCOUNTER — Other Ambulatory Visit (HOSPITAL_COMMUNITY): Payer: BC Managed Care – PPO

## 2020-12-27 ENCOUNTER — Ambulatory Visit: Payer: BC Managed Care – PPO | Admitting: Cardiology

## 2021-01-06 ENCOUNTER — Telehealth (HOSPITAL_COMMUNITY): Payer: Self-pay | Admitting: *Deleted

## 2021-01-06 NOTE — Telephone Encounter (Signed)
Reaching out to patient to offer assistance regarding upcoming cardiac imaging study; pt verbalizes understanding of appt date/time, parking situation and where to check in, and verified current allergies; name and call back number provided for further questions should they arise  Kim Gandolfo RN Navigator Cardiac Imaging [facility] [phone] office [phone] cell  

## 2021-01-07 ENCOUNTER — Other Ambulatory Visit: Payer: Self-pay

## 2021-01-07 ENCOUNTER — Ambulatory Visit (HOSPITAL_COMMUNITY)
Admission: RE | Admit: 2021-01-07 | Discharge: 2021-01-07 | Disposition: A | Discharge status: Home / Self Care | Payer: BC Managed Care – PPO | Source: Ambulatory Visit | Attending: Cardiology | Admitting: Cardiology

## 2021-01-07 DIAGNOSIS — I493 Ventricular premature depolarization: Secondary | ICD-10-CM | POA: Insufficient documentation

## 2021-01-07 MED ORDER — GADOBUTROL 1 MMOL/ML IV SOLN
8.0000 mL | Freq: Once | INTRAVENOUS | Status: AC | PRN
  Administered 2021-01-07: 8 mL via INTRAVENOUS

## 2021-01-14 ENCOUNTER — Ambulatory Visit: Payer: BC Managed Care – PPO | Admitting: Cardiology

## 2021-01-14 ENCOUNTER — Other Ambulatory Visit: Payer: Self-pay

## 2021-01-14 ENCOUNTER — Encounter: Payer: Self-pay | Admitting: Cardiology

## 2021-01-14 VITALS — BP 104/62 | HR 89 | Ht 61.0 in | Wt 121.2 lb

## 2021-01-14 DIAGNOSIS — I493 Ventricular premature depolarization: Secondary | ICD-10-CM | POA: Diagnosis not present

## 2021-01-14 NOTE — Patient Instructions (Addendum)
Medication Instructions:  Your physician recommends that you continue on your current medications as directed. Please refer to the Current Medication list given to you today. *If you need a refill on your cardiac medications before your next appointment, please call your pharmacy*  Lab Work: None ordered.  If you have labs (blood work) drawn today and your tests are completely normal, you will receive your results only by: . MyChart Message (if you have MyChart) OR . A paper copy in the mail If you have any lab test that is abnormal or we need to change your treatment, we will call you to review the results.  Testing/Procedures: None ordered.  Follow-Up: At [facility], you and your health needs are our priority.  As part of our continuing mission to provide you with exceptional heart care, we have created designated Provider Care Teams.  These Care Teams include your primary Cardiologist (physician) and Advanced Practice Providers (APPs -  Physician Assistants and Nurse Practitioners) who all work together to provide you with the care you need, when you need it.  Your next appointment:   Your physician wants you to follow-up in: 6 months with Dr. Lambert.  You will receive a reminder letter in the mail two months in advance. If you don't receive a letter, please call our office to schedule the follow-up appointment.   

## 2021-01-14 NOTE — Progress Notes (Signed)
Electrophysiology Office Follow up Visit Note:    Date:  01/14/2021   ID:  Kim Newton, DOB 07-03-1961, MRN [id number]  PCP:  Mitchel Honour, DO  [facility] Cardiologist:  Lesleigh Noe, MD  [facility] Electrophysiologist:  Lanier Prude, MD    Interval History:    Kim Newton is a 60 y.o. female who presents for a follow up visit.  I last saw the patient November 19, 2020 for her PVCs.  She is asymptomatic.  At that visit we ordered a cardiac MRI to exclude sarcoid.  The patient is in clinic with her husband today.  She tells me that she has not been as active as she previously was because of changes to her work routine.  Her husband tells me that she is working later in the day which interrupts the time she would have previously exercised.  Since I saw her she is taking a trip to [place] with her husband.  They were extremely active during the trip 1 day logging 6 miles walking without any limitations.   Past Medical History:  Diagnosis Date  . Anal fistula   . History of rectal abscess   . Hyperlipidemia   . Wears glasses     Past Surgical History:  Procedure Laterality Date  . CESAREAN SECTION  x2 last one 1995  . REPAIR UMBILICAL AND VENTRAL HERNIA'S  01-29-2004    dr gerkin  [facility]    Current Medications: Current Meds  Medication Sig  . estradiol (CLIMARA - DOSED IN MG/24 HR) 0.075 mg/24hr patch Place 0.075 mg onto the skin once a week.  . levocetirizine (XYZAL) 5 MG tablet Take 5 mg by mouth at bedtime.  . montelukast (SINGULAIR) 10 MG tablet Take 1 tablet (10 mg total) by mouth at bedtime.  . Multiple Vitamin (MULTIVITAMIN) tablet Take 1 tablet by mouth daily.  . progesterone (PROMETRIUM) 100 MG capsule Take 100 mg by mouth at bedtime.     Allergies:   Patient has no known allergies.   Social History   Socioeconomic History  . Marital status: Married    Spouse name: Not on file  . Number of children: Not on file  . Years of education: Not  on file  . Highest education level: Not on file  Occupational History  . Not on file  Tobacco Use  . Smoking status: Never Smoker  . Smokeless tobacco: Never Used  Substance and Sexual Activity  . Alcohol use: Yes    Alcohol/week: 2.0 standard drinks    Types: 2 Glasses of wine per week    Comment: occasional  . Drug use: Not on file  . Sexual activity: Not on file  Other Topics Concern  . Not on file  Social History Narrative  . Not on file   Social Determinants of Health   Financial Resource Strain: Not on file  Food Insecurity: Not on file  Transportation Needs: Not on file  Physical Activity: Not on file  Stress: Not on file  Social Connections: Not on file     Family History: The patient's family history includes Colon cancer (age of onset: 79) in her mother; Myasthenia gravis in her father.  ROS:   Please see the history of present illness.    All other systems reviewed and are negative.  EKGs/Labs/Other Studies Reviewed:    The following studies were reviewed today:  January 07, 2021 cardiac MRI left ventricular ejection fraction of 50% with mild global  hypokinesis.  There was no LGE.  The right ventricle was normal.  There were no significant valvular abnormalities.  December 12, 2020 echo personally reviewed Left ventricular function mildly reduced, 50%.  Right ventricular function normal Mild mitral regurgitation Mild aortic regurgitation   EKG:  The ekg ordered today demonstrates sinus rhythm with bigeminal or trigeminal PVCs that are monomorphic.  PVCs have a steeply inferior axis and a precordial transition in V3 that precedes the sinus precordial transition.  Recent Labs: 10/09/2020: ALT 9; BUN 11; Creatinine, Ser 0.94; Magnesium 2.2; Potassium 4.6; Sodium 140; TSH 1.330  Recent Lipid Panel    Component Value Date/Time   CHOL 269 (H) 10/09/2020 1520   TRIG 145 10/09/2020 1520   HDL 80 10/09/2020 1520   CHOLHDL 3.4 10/09/2020 1520   LDLCALC 163 (H)  10/09/2020 1520    Physical Exam:    VS:  BP 104/62   Pulse 89   Ht 5\' 1"  (1.549 m)   Wt 121 lb 3.2 oz (55 kg)   LMP 12/03/2011   SpO2 96%   BMI 22.90 kg/m     Wt Readings from Last 3 Encounters:  01/14/21 121 lb 3.2 oz (55 kg)  11/19/20 124 lb (56.2 kg)  10/09/20 125 lb 3.2 oz (56.8 kg)     GEN:  Well nourished, well developed in no acute distress HEENT: Normal NECK: No JVD; No carotid bruits LYMPHATICS: No lymphadenopathy CARDIAC: RRR, no murmurs, rubs, gallops RESPIRATORY:  Clear to auscultation without rales, wheezing or rhonchi  ABDOMEN: Soft, non-tender, non-distended MUSCULOSKELETAL:  No edema; No deformity  SKIN: Warm and dry NEUROLOGIC:  Alert and oriented x 3 PSYCHIATRIC:  Normal affect   ASSESSMENT:    1. PVC (premature ventricular contraction)    PLAN:    In order of problems listed above:  1. Frequent PVCs Previous monitor showed a 22% burden of PVCs likely originating from the outflow tracts.   Cardiac MRI shows no LGE and an ejection fraction of 50%. Patient is asymptomatic. We discussed the management options including using a beta-blocker/calcium channel blocker, antiarrhythmic drug or ablation.  Given her relatively low blood pressures, I do not think pharmacologic therapy is the best option.  Given the asymptomatic nature of her PVCs and normal left ventricular function, I do not think an invasive approach is warranted at this time but could certainly be considered in the future given the probable origin within the right ventricular outflow tract. At this point, I favor continuing with a conservative approach.  We will plan to follow-up in approximately 6 months.  We will likely plan to repeat the echocardiogram at that time to confirm the left ventricular function is normal. I have asked the patient and her husband to reach out should she develop symptoms or changes in her exercise capacity.  Follow-up in 6 months   Medication Adjustments/Labs  and Tests Ordered: Current medicines are reviewed at length with the patient today.  Concerns regarding medicines are outlined above.  Orders Placed This Encounter  Procedures  . EKG 12-Lead   No orders of the defined types were placed in this encounter.    Signed, 12/07/20, MD, [facility], [facility] 01/14/2021 9:18 AM    Electrophysiology [facility]

## 2021-04-18 IMAGING — MR MR CARD MORPHOLOGY WO/W CM
44 of 48 series · 44 of 48 positions shown · IV contrast (Contrast agent)
Comparison: none

CLINICAL DATA: Clinical question of cardiomyopathy
60 year old African American Female

HCT not done: Will perform T2 parametric mapping.
EXAM:
CARDIAC MRI
TECHNIQUE: The patient was scanned on a 1.5 Tesla GE magnet. A dedicated
cardiac coil was used. Functional imaging was done using Fiesta
sequences. [DATE], and 4 chamber views were done to assess for RWMA's.
Modified Zeinab rule using a short axis stack was used to
calculate an ejection fraction on a dedicated work station using
Circle software. The patient received 8 cc of Gadavist. After 10
minutes inversion recovery sequences were used to assess for
infiltration and scar tissue.
CONTRAST:  8 cc  of Gadavist

[Series 4: [id number]_bh · axial · 8.0mm · 1.41mm/px · 1 of 16 slices shown]
[im 1/16]
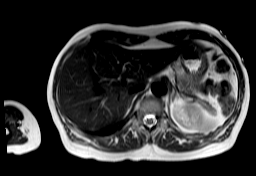

[Series 10: bSSFP · oblique · 8.0mm · 1.61mm/px · 1 of 25 slices shown (1 of 20)]
[im 1/25]
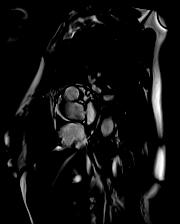

[Series 11: bSSFP · oblique · 8.0mm · 1.61mm/px · 1 of 25 slices shown (2 of 20)]
[im 1/25]
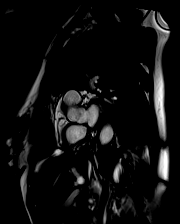

[Series 12: bSSFP · oblique · 8.0mm · 1.61mm/px · 1 of 25 slices shown (3 of 20)]
[im 1/25]
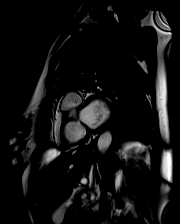

[Series 13: bSSFP · oblique · 8.0mm · 1.61mm/px · 1 of 25 slices shown (4 of 20)]
[im 1/25]
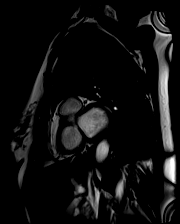

[Series 14: bSSFP · oblique · 8.0mm · 1.61mm/px · 1 of 25 slices shown (5 of 20)]
[im 1/25]
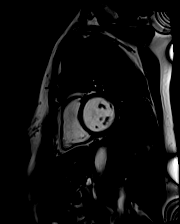

[Series 15: bSSFP · oblique · 8.0mm · 1.61mm/px · 1 of 25 slices shown (6 of 20)]
[im 1/25]
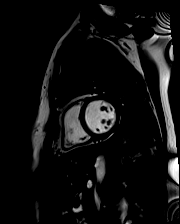

[Series 16: bSSFP · oblique · 8.0mm · 1.61mm/px · 1 of 25 slices shown (7 of 20)]
[im 1/25]
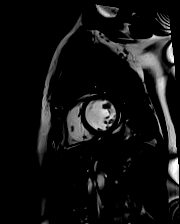

[Series 17: bSSFP · oblique · 8.0mm · 1.61mm/px · 1 of 25 slices shown (8 of 20)]
[im 1/25]
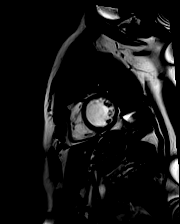

[Series 18: bSSFP · oblique · 8.0mm · 1.61mm/px · 1 of 25 slices shown (9 of 20)]
[im 1/25]
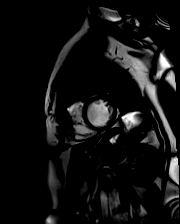

[Series 19: bSSFP · oblique · 8.0mm · 1.61mm/px · 1 of 25 slices shown (10 of 20)]
[im 1/25]
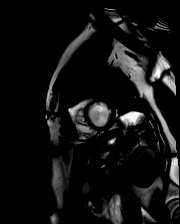

[Series 20: bSSFP · oblique · 8.0mm · 1.61mm/px · 1 of 25 slices shown (11 of 20)]
[im 1/25]
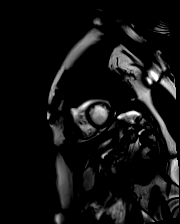

[Series 21: bSSFP · oblique · 8.0mm · 1.61mm/px · 1 of 25 slices shown (12 of 20)]
[im 1/25]
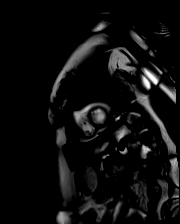

[Series 22: bSSFP · oblique · 8.0mm · 1.61mm/px · 1 of 25 slices shown (13 of 20)]
[im 1/25]
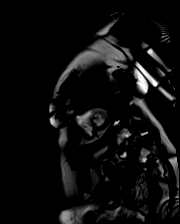

[Series 23: bSSFP · oblique · 8.0mm · 1.61mm/px · 1 of 25 slices shown (14 of 20)]
[im 1/25]
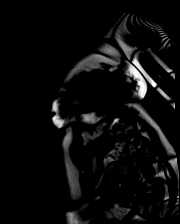

[Series 24: bSSFP · oblique · 8.0mm · 1.61mm/px · 1 of 25 slices shown (15 of 20)]
[im 1/25]
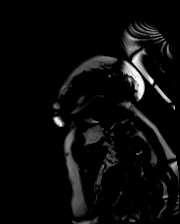

[Series 25: bSSFP · oblique · 8.0mm · 1.61mm/px · 1 of 25 slices shown (16 of 20)]
[im 1/25]
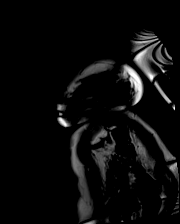

[Series 26: bSSFP · oblique · 8.0mm · 1.61mm/px · 1 of 25 slices shown (17 of 20)]
[im 1/25]
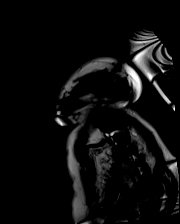

[Series 27: (id)_long_t1 · oblique · 8.0mm · 1.56mm/px · 1 of 24 slices shown]
[im 1/24]
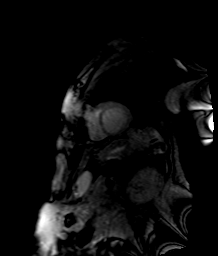

[Series 28: (id)_long_t1_moco · oblique · 8.0mm · 1.56mm/px · 1 of 24 slices shown]
[im 1/24]
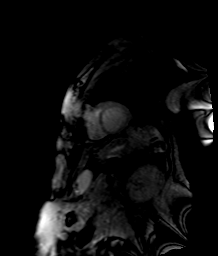

[Series 31: (id)_trufi · oblique · 8.0mm · 2.08mm/px · 1 of 9 slices shown]
[im 1/9]
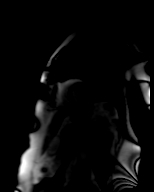

[Series 32: (id)_trufi_moco · oblique · 8.0mm · 2.08mm/px · 1 of 9 slices shown]
[im 1/9]
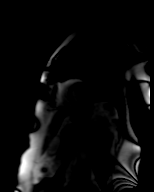

[Series 35: bSSFP · axial · 6.0mm · 1.41mm/px · 1 of 25 slices shown (18 of 20)]
[im 1/25]
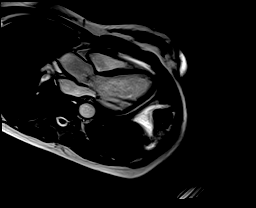

[Series 36: bSSFP · oblique · 6.0mm · 1.41mm/px · 1 of 25 slices shown (19 of 20)]
[im 1/25]
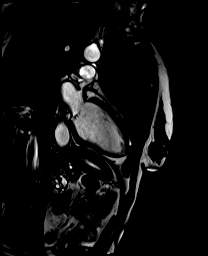

[Series 37: bSSFP · oblique · 6.0mm · 1.41mm/px · 1 of 25 slices shown (20 of 20)]
[im 1/25]
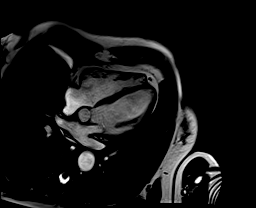

[Series 38: t1_tse_db short axis · axial · 8.0mm · 1.32mm/px · 1 of 13 slices shown]
[im 1/13]
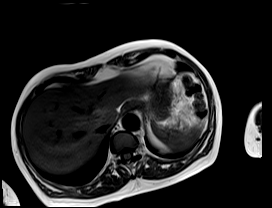

[Series 39: t1_tse_fs db short · axial · 8.0mm · 1.32mm/px · 1 of 13 slices shown]
[im 1/13]
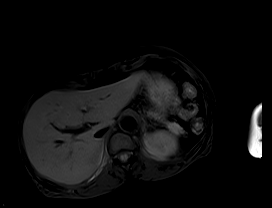

[Series 40: pre short axis · oblique · non-contrast · 8.0mm · 2.25mm/px · 1 of 10 slices shown (1 of 6)]
[im 1/10]
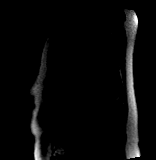

[Series 41: pre short axis · oblique · non-contrast · 8.0mm · 2.25mm/px · 1 of 10 slices shown (2 of 6)]
[im 1/10]
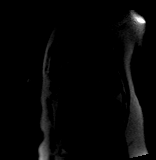

[Series 42: pre short axis · oblique · non-contrast · 8.0mm · 2.25mm/px · 1 of 10 slices shown (3 of 6)]
[im 1/10]
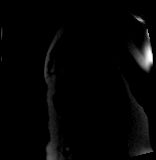

[Series 43: pre short axis · oblique · non-contrast · 8.0mm · 2.25mm/px · 1 of 10 slices shown (4 of 6)]
[im 1/10]
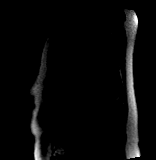

[Series 44: pre short axis · oblique · non-contrast · 8.0mm · 2.25mm/px · 1 of 10 slices shown (5 of 6)]
[im 1/10]
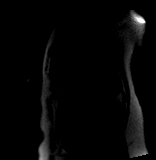

[Series 45: pre short axis · oblique · non-contrast · 8.0mm · 2.25mm/px · 1 of 10 slices shown (6 of 6)]
[im 1/10]
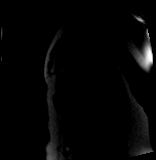

[Series 46: rest short axis · oblique · 8.0mm · 2.25mm/px · 1 of 60 slices shown (1 of 11)]
[im 1/60]
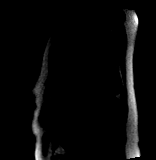

[Series 47: rest short axis · oblique · 8.0mm · 2.25mm/px · 1 of 60 slices shown (2 of 11)]
[im 1/60]
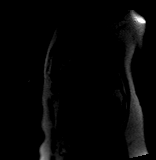

[Series 48: rest short axis · oblique · 8.0mm · 2.25mm/px · 1 of 60 slices shown (3 of 11)]
[im 1/60]
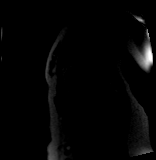

[Series 49: rest short axis · oblique · 8.0mm · 2.25mm/px · 1 of 60 slices shown (4 of 11)]
[im 1/60]
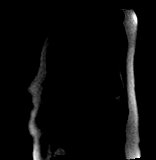

[Series 50: rest short axis · oblique · 8.0mm · 2.25mm/px · 1 of 60 slices shown (5 of 11)]
[im 1/60]
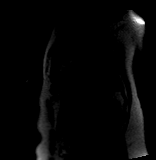

[Series 51: rest short axis · oblique · 8.0mm · 2.25mm/px · 1 of 60 slices shown (6 of 11)]
[im 1/60]
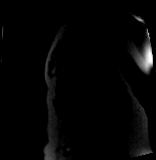

[Series 52: rest short axis · oblique · 8.0mm · 2.25mm/px · 1 of 60 slices shown (7 of 11)]
[im 1/60]
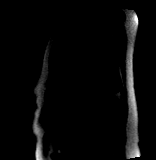

[Series 53: rest short axis · oblique · 8.0mm · 2.25mm/px · 1 of 60 slices shown (8 of 11)]
[im 1/60]
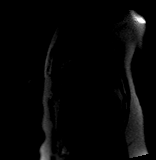

[Series 54: rest short axis · oblique · 8.0mm · 2.25mm/px · 1 of 60 slices shown (9 of 11)]
[im 1/60]
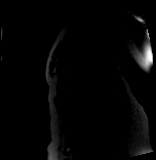

[Series 55: rest short axis · oblique · 8.0mm · 2.25mm/px · 1 of 60 slices shown (10 of 11)]
[im 1/60]
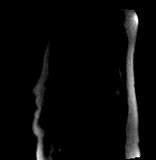

[Series 56: rest short axis · oblique · 8.0mm · 2.25mm/px · 1 of 60 slices shown (11 of 11)]
[im 1/60]
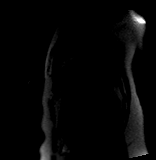

[44 of 48 positions shown; findings below may reference images not displayed]

FINDINGS: 1. Mild left ventricular dilation, with LVEDD 55 mm, and LVEDVi 89
mL/m2.

Normal left ventricular thickness, with intraventricular septal
thickness of 4 mm, posterior wall thickness of 5 mm.

Mildly reduced left ventricular systolic function (LVEF =50%). There
is mild global hypokinesis with no regional wall motion
abnormalities.

Left ventricular parametric mapping notable for Normal T2 signal
(highest of 52 ms in mid inferolateral distribution).

There is no late gadolinium enhancement in the left ventricular
myocardium.

2. Normal right ventricular size with RVEDVI 73 mL/m2.

Normal right ventricular thickness.

Normal right ventricular systolic function (RVEF =52%). There are no
regional wall motion abnormalities or aneurysms.

3.  Normal left and right atrial size.

4. Normal size of the aortic root, ascending aorta and pulmonary
artery.

5.  Qualitatively, no significant valvular abnormalities.

6.  Normal pericardium.  No pericardial effusion.

7. Grossly, no extracardiac findings. Recommended dedicated study if
concerned for non-cardiac pathology.
IMPRESSION: 1. Mild left ventricular dilation, with LVEDD 55 mm, and LVEDVi 89
mL/m2.

2. Mildly reduced left ventricular systolic function (LVEF =50%).
There is mild global hypokinesis with no regional wall motion
abnormalities.

3. There is no late gadolinium enhancement in the left ventricular
myocardium.

## 2021-07-20 NOTE — Progress Notes (Signed)
Electrophysiology Office Follow up Visit Note:    Date:  07/21/2021   ID:  Kim Newton, DOB January 08, 1961, MRN [id number]  PCP:  Linda Hedges, DO  [facility] Cardiologist:  Sinclair Grooms, MD  [facility] Electrophysiologist:  Vickie Epley, MD    Interval History:    Kim Newton is a 60 y.o. female who presents for a follow up visit. They were last seen in clinic January 14, 2021 for PVCs.  In the past she had normal left ventricular function and the PVCs were asymptomatic.  We planned follow-up today to recheck left ventricular function and for any symptoms related to her PVCs.  In the past, a conservative management strategy was pursued.  Today she is with her husband who I have previously met.  She tells me she has been about the same since I last saw her.  Still quite active.       Past Medical History:  Diagnosis Date   Anal fistula    History of rectal abscess    Hyperlipidemia    Wears glasses     Past Surgical History:  Procedure Laterality Date   CESAREAN SECTION  x2 last one 3559   REPAIR UMBILICAL AND VENTRAL HERNIA'S  01-29-2004    dr gerkin  [facility]    Current Medications: Current Meds  Medication Sig   estradiol (CLIMARA - DOSED IN MG/24 HR) 0.075 mg/24hr patch Place 0.075 mg onto the skin once a week.   levocetirizine (XYZAL) 5 MG tablet Take 5 mg by mouth at bedtime.   montelukast (SINGULAIR) 10 MG tablet Take 1 tablet (10 mg total) by mouth at bedtime.   Multiple Vitamin (MULTIVITAMIN) tablet Take 1 tablet by mouth daily.   progesterone (PROMETRIUM) 100 MG capsule Take 100 mg by mouth at bedtime.     Allergies:   Patient has no known allergies.   Social History   Socioeconomic History   Marital status: Married    Spouse name: Not on file   Number of children: Not on file   Years of education: Not on file   Highest education level: Not on file  Occupational History   Not on file  Tobacco Use   Smoking status: Never   Smokeless  tobacco: Never  Substance and Sexual Activity   Alcohol use: Yes    Alcohol/week: 2.0 standard drinks    Types: 2 Glasses of wine per week    Comment: occasional   Drug use: Not on file   Sexual activity: Not on file  Other Topics Concern   Not on file  Social History Narrative   Not on file   Social Determinants of Health   Financial Resource Strain: Not on file  Food Insecurity: Not on file  Transportation Needs: Not on file  Physical Activity: Not on file  Stress: Not on file  Social Connections: Not on file     Family History: The patient's family history includes Colon cancer (age of onset: 67) in her mother; Myasthenia gravis in her father.  ROS:   Please see the history of present illness.    All other systems reviewed and are negative.  EKGs/Labs/Other Studies Reviewed:    The following studies were reviewed today:  January 07, 2021 cardiac MRI LV systolic function 74% No LGE  EKG:  The ekg ordered today demonstrates sinus rhythm with bigeminal PVCs.  PVCs have a steeply inferior axis and a precordial transition in lead V2.  PVCs are late  coupled.  Recent Labs: 10/09/2020: ALT 9; BUN 11; Creatinine, Ser 0.94; Magnesium 2.2; Potassium 4.6; Sodium 140; TSH 1.330  Recent Lipid Panel    Component Value Date/Time   CHOL 269 (H) 10/09/2020 1520   TRIG 145 10/09/2020 1520   HDL 80 10/09/2020 1520   CHOLHDL 3.4 10/09/2020 1520   LDLCALC 163 (H) 10/09/2020 1520    Physical Exam:    VS:  BP 110/76   Pulse 88   Ht 5' 1" (1.549 m)   Wt 121 lb (54.9 kg)   LMP 12/03/2011   BMI 22.86 kg/m     Wt Readings from Last 3 Encounters:  07/21/21 121 lb (54.9 kg)  01/14/21 121 lb 3.2 oz (55 kg)  11/19/20 124 lb (56.2 kg)     GEN:  Well nourished, well developed in no acute distress HEENT: Normal NECK: No JVD; No carotid bruits LYMPHATICS: No lymphadenopathy CARDIAC: RRR, no murmurs, rubs, gallops RESPIRATORY:  Clear to auscultation without rales, wheezing or  rhonchi  ABDOMEN: Soft, non-tender, non-distended MUSCULOSKELETAL:  No edema; No deformity  SKIN: Warm and dry NEUROLOGIC:  Alert and oriented x 3 PSYCHIATRIC:  Normal affect        ASSESSMENT:    1. PVC (premature ventricular contraction)    PLAN:    In order of problems listed above:  #PVCs Very frequent.  Asymptomatic.  Preserved left ventricular function.  We discussed the available treatment options including continued conservative management, suppression with antiarrhythmic drug therapy and suppression using catheter ablation.  Given the asymptomatic nature of her PVCs and the normal left ventricular function, the patient is elected to pursue a conservative management strategy.  I will plan to repeat the echocardiogram to confirm normal left ventricular function and have her follow-up in 1 year or sooner as needed.   Total time spent with patient today 30 minutes. This includes reviewing records, evaluating the patient and coordinating care.   Medication Adjustments/Labs and Tests Ordered: Current medicines are reviewed at length with the patient today.  Concerns regarding medicines are outlined above.  Orders Placed This Encounter  Procedures   EKG 12-Lead   ECHOCARDIOGRAM COMPLETE   No orders of the defined types were placed in this encounter.    Signed, Lars Mage, MD, [facility], [facility] 07/21/2021 8:09 PM    Electrophysiology [facility]

## 2021-07-21 ENCOUNTER — Encounter: Payer: Self-pay | Admitting: Cardiology

## 2021-07-21 ENCOUNTER — Ambulatory Visit: Payer: BC Managed Care – PPO | Admitting: Cardiology

## 2021-07-21 ENCOUNTER — Other Ambulatory Visit: Payer: Self-pay

## 2021-07-21 VITALS — BP 110/76 | HR 88 | Ht 61.0 in | Wt 121.0 lb

## 2021-07-21 DIAGNOSIS — I493 Ventricular premature depolarization: Secondary | ICD-10-CM | POA: Diagnosis not present

## 2021-07-21 NOTE — Patient Instructions (Addendum)
Medication Instructions:  Your physician recommends that you continue on your current medications as directed. Please refer to the Current Medication list given to you today. *If you need a refill on your cardiac medications before your next appointment, please call your pharmacy*  Lab Work: None ordered. If you have labs (blood work) drawn today and your tests are completely normal, you will receive your results only by: MyChart Message (if you have MyChart) OR A paper copy in the mail If you have any lab test that is abnormal or we need to change your treatment, we will call you to review the results.  Testing/Procedures: Your physician has requested that you have an echocardiogram. Echocardiography is a painless test that uses sound waves to create images of your heart. It provides your doctor with information about the size and shape of your heart and how well your heart's chambers and valves are working. This procedure takes approximately one hour. There are no restrictions for this procedure.  Please schedule for ECHO  Follow-Up: At [facility], you and your health needs are our priority.  As part of our continuing mission to provide you with exceptional heart care, we have created designated Provider Care Teams.  These Care Teams include your primary Cardiologist (physician) and Advanced Practice Providers (APPs -  Physician Assistants and Nurse Practitioners) who all work together to provide you with the care you need, when you need it.  Your next appointment:   Your physician wants you to follow-up in: one year with one of the following Advanced Practice Providers on your designated Care Team:   Francis Dowse, PA-C Casimiro Needle "Mardelle Matte" [place], [place] You will receive a reminder letter in the mail two months in advance. If you don't receive a letter, please call our office to schedule the follow-up appointment.

## 2021-07-30 ENCOUNTER — Other Ambulatory Visit: Payer: Self-pay

## 2021-07-30 ENCOUNTER — Ambulatory Visit (HOSPITAL_COMMUNITY): Payer: BC Managed Care – PPO | Attending: Cardiology

## 2021-07-30 DIAGNOSIS — I493 Ventricular premature depolarization: Secondary | ICD-10-CM

## 2021-07-30 LAB — ECHOCARDIOGRAM COMPLETE
Area-P 1/2: 3.85 cm2
S' Lateral: 2.9 cm

## 2021-09-02 ENCOUNTER — Other Ambulatory Visit: Payer: Self-pay

## 2021-09-02 ENCOUNTER — Ambulatory Visit (INDEPENDENT_AMBULATORY_CARE_PROVIDER_SITE_OTHER): Payer: BC Managed Care – PPO | Admitting: Nurse Practitioner

## 2021-09-02 ENCOUNTER — Encounter: Payer: Self-pay | Admitting: Nurse Practitioner

## 2021-09-02 VITALS — BP 114/70 | HR 55 | Temp 97.8°F | Ht 60.8 in | Wt 121.4 lb

## 2021-09-02 DIAGNOSIS — E785 Hyperlipidemia, unspecified: Secondary | ICD-10-CM | POA: Diagnosis not present

## 2021-09-02 DIAGNOSIS — Z7689 Persons encountering health services in other specified circumstances: Secondary | ICD-10-CM

## 2021-09-02 DIAGNOSIS — Z6823 Body mass index (BMI) 23.0-23.9, adult: Secondary | ICD-10-CM

## 2021-09-02 DIAGNOSIS — I493 Ventricular premature depolarization: Secondary | ICD-10-CM

## 2021-09-02 NOTE — Patient Instructions (Signed)

## 2021-09-02 NOTE — Progress Notes (Signed)
I,Jameka J Llittleton,acting as a Neurosurgeon for [employer], NP.,have documented all relevant documentation on the behalf of [employer], NP,as directed by  Charlesetta Ivory, NP while in the presence of Charlesetta Ivory, NP.  This visit occurred during the SARS-CoV-2 public health emergency.  Safety protocols were in place, including screening questions prior to the visit, additional usage of staff PPE, and extensive cleaning of exam room while observing appropriate contact time as indicated for disinfecting solutions.  Subjective:     Patient ID: Kim Newton [place] , female    DOB: [birth year] , 60 y.o.   MRN: [id number]   Chief Complaint  Patient presents with   Establish Care   Hyperlipidemia   HPI  Patient presents today to establish primary care. She would like to be seen for her cholesterol.  She has had a mammogram this year with her OBYGN.  Colonoscopy was done in Dec. 2021.She gets that done every 5 years due to family history.  Diet: She doesn't eat a lot but states she does not eat the best. She does not drink a lot of milk but drink her coffee with cream and sugar.   Exercise: She hasn't really gotten much exercise done recently but she is active.  Drink/smoke:  she does drink occasionally.   Hyperlipidemia Pertinent negatives include no chest pain or shortness of breath.    Past Medical History:  Diagnosis Date   Anal fistula    History of rectal abscess    Hyperlipidemia    PVC (premature ventricular contraction)    Wears glasses      Family History  Problem Relation Age of Onset   Colon cancer Mother 76   Myasthenia gravis Father      Current Outpatient Medications:    estradiol (CLIMARA - DOSED IN MG/24 HR) 0.075 mg/24hr patch, Place 0.075 mg onto the skin once a week., Disp: , Rfl:    levocetirizine (XYZAL) 5 MG tablet, Take 5 mg by mouth at bedtime., Disp: , Rfl:    Multiple Vitamin (MULTIVITAMIN) tablet, Take 1 tablet by mouth daily., Disp: , Rfl:     progesterone (PROMETRIUM) 100 MG capsule, Take 100 mg by mouth at bedtime., Disp: , Rfl:    No Known Allergies   Review of Systems  Constitutional: Negative.  Negative for chills and fever.  HENT:  Negative for congestion.   Eyes: Negative.   Respiratory:  Negative for cough and shortness of breath.   Cardiovascular:  Negative for chest pain and palpitations.  Endocrine: Negative.   Musculoskeletal: Negative.   Skin: Negative.   Neurological:  Negative for dizziness and numbness.  Psychiatric/Behavioral: Negative.      Today's Vitals   09/02/21 1447  BP: 114/70  Pulse: (!) 55  Temp: 97.8 F (36.6 C)  Weight: 121 lb 6.4 oz (55.1 kg)  Height: 5' 0.8" (1.544 m)  PainSc: 0-No pain   Body mass index is 23.09 kg/m.  Wt Readings from Last 3 Encounters:  09/02/21 121 lb 6.4 oz (55.1 kg)  07/21/21 121 lb (54.9 kg)  01/14/21 121 lb 3.2 oz (55 kg)    Objective:  Physical Exam Constitutional:      Appearance: Normal appearance.  HENT:     Head: Normocephalic and atraumatic.  Cardiovascular:     Rate and Rhythm: Normal rate and regular rhythm.     Pulses: Normal pulses.     Heart sounds: No murmur heard. Pulmonary:     Effort: Pulmonary effort is normal. No respiratory  distress.     Breath sounds: No wheezing.  Skin:    General: Skin is warm and dry.     Capillary Refill: Capillary refill takes less than 2 seconds.  Neurological:     Mental Status: She is alert.       Assessment And Plan:     1. Establishing care with new doctor, encounter for --Patient is here to establish care. Martin Majestic over patient medical, family, social and surgical history. -Reviewed with patient their medications and any allergies  -Reviewed with patient their sexual orientation, drug/tobacco and alcohol use -Dicussed any new concerns with patient  -recommended patient comes in for a physical exam and complete blood work.  -Educated patient about the importance of annual screenings and  immunizations.  -Advised patient to eat a healthy diet along with exercise for atleast 30-45 min atleast 4-5 days of the week.   2. Hyperlipidemia, unspecified hyperlipidemia type --Educated patient about a diet that is low in fat and high fatty foods including dairy products. Increase in take of fish and fiber. Decrease intake of red meats and fast foods. Exercise for atleast 4-5 times a week or atleast 30-45 min. Drink a lot of water.   - Lipid panel; Future  3. PVC (premature ventricular contraction) -Stable, followed by cardiologist   4. BMI 23.0-23.9, adult  -Advised patient on a healthy diet including avoiding fast food and red meats. Increase the intake of lean meats including grilled chicken and [place].  Drink a lot of water. Decrease intake of fatty foods. Exercise for 30-45 min. 4-5 a week to decrease the risk of cardiac event.    The patient was encouraged to call or send a message through [facility] for any questions or concerns.   Follow up: if symptoms persist or do not get better.   Side effects and appropriate use of all the medication(s) were discussed with the patient today. Patient advised to use the medication(s) as directed by their healthcare provider. The patient was encouraged to read, review, and understand all associated package inserts and contact our office with any questions or concerns. The patient accepts the risks of the treatment plan and had an opportunity to ask questions.   Patient was given opportunity to ask questions. Patient verbalized understanding of the plan and was able to repeat key elements of the plan. All questions were answered to their satisfaction.  Raman Anothony Bursch, DNP   I, Raman Kiel Cockerell have reviewed all documentation for this visit. The documentation on 09/02/21 for the exam, diagnosis, procedures, and orders are all accurate and complete.   IF YOU HAVE BEEN REFERRED TO A SPECIALIST, IT MAY TAKE 1-2 WEEKS TO SCHEDULE/PROCESS THE REFERRAL. IF  YOU HAVE NOT HEARD FROM US/SPECIALIST IN TWO WEEKS, PLEASE GIVE [place] A CALL AT [phone] X [phone].   THE PATIENT IS ENCOURAGED TO PRACTICE SOCIAL DISTANCING DUE TO THE COVID-19 PANDEMIC.

## 2021-09-03 ENCOUNTER — Other Ambulatory Visit: Payer: BC Managed Care – PPO

## 2021-09-03 DIAGNOSIS — E785 Hyperlipidemia, unspecified: Secondary | ICD-10-CM | POA: Diagnosis not present

## 2021-09-04 LAB — LIPID PANEL
Chol/HDL Ratio: 3.4 ratio (ref 0.0–4.4)
Cholesterol, Total: 256 mg/dL — ABNORMAL HIGH (ref 100–199)
HDL: 75 mg/dL (ref >39)
LDL Chol Calc (NIH): 167 mg/dL — ABNORMAL HIGH (ref 0–99)
Triglycerides: 83 mg/dL (ref 0–149)
VLDL Cholesterol Cal: 14 mg/dL (ref 5–40)

## 2021-09-30 ENCOUNTER — Encounter: Payer: Self-pay | Admitting: Nurse Practitioner

## 2021-09-30 NOTE — Progress Notes (Signed)
Atorvastatin. Please let me know if she is interested in starting it.

## 2021-10-14 ENCOUNTER — Other Ambulatory Visit: Payer: Self-pay | Admitting: Nurse Practitioner

## 2021-10-14 DIAGNOSIS — E785 Hyperlipidemia, unspecified: Secondary | ICD-10-CM

## 2021-10-14 MED ORDER — ATORVASTATIN CALCIUM 10 MG PO TABS: ORAL_TABLET | ORAL | 2 refills | Status: DC

## 2021-10-14 NOTE — Progress Notes (Signed)
Have already talked to the patient.

## 2021-12-09 DIAGNOSIS — Z6823 Body mass index (BMI) 23.0-23.9, adult: Secondary | ICD-10-CM | POA: Diagnosis not present

## 2021-12-09 DIAGNOSIS — Z13820 Encounter for screening for osteoporosis: Secondary | ICD-10-CM | POA: Diagnosis not present

## 2021-12-09 DIAGNOSIS — Z1231 Encounter for screening mammogram for malignant neoplasm of breast: Secondary | ICD-10-CM | POA: Diagnosis not present

## 2021-12-09 DIAGNOSIS — Z01419 Encounter for gynecological examination (general) (routine) without abnormal findings: Secondary | ICD-10-CM | POA: Diagnosis not present

## 2021-12-09 LAB — HM DEXA SCAN

## 2022-01-22 DIAGNOSIS — M810 Age-related osteoporosis without current pathological fracture: Secondary | ICD-10-CM | POA: Diagnosis not present

## 2022-02-05 DIAGNOSIS — M810 Age-related osteoporosis without current pathological fracture: Secondary | ICD-10-CM | POA: Diagnosis not present

## 2022-05-25 ENCOUNTER — Encounter: Payer: Self-pay | Admitting: Internal Medicine

## 2022-06-02 ENCOUNTER — Encounter: Payer: Self-pay | Admitting: Internal Medicine

## 2022-06-02 ENCOUNTER — Ambulatory Visit (INDEPENDENT_AMBULATORY_CARE_PROVIDER_SITE_OTHER): Payer: BC Managed Care – PPO | Admitting: Internal Medicine

## 2022-06-02 VITALS — BP 122/78 | HR 77 | Temp 98.1°F | Ht 60.0 in | Wt 117.6 lb

## 2022-06-02 DIAGNOSIS — Z6823 Body mass index (BMI) 23.0-23.9, adult: Secondary | ICD-10-CM | POA: Diagnosis not present

## 2022-06-02 DIAGNOSIS — E7800 Pure hypercholesterolemia, unspecified: Secondary | ICD-10-CM | POA: Diagnosis not present

## 2022-06-02 DIAGNOSIS — Z23 Encounter for immunization: Secondary | ICD-10-CM | POA: Diagnosis not present

## 2022-06-02 DIAGNOSIS — I493 Ventricular premature depolarization: Secondary | ICD-10-CM | POA: Diagnosis not present

## 2022-06-02 DIAGNOSIS — E785 Hyperlipidemia, unspecified: Secondary | ICD-10-CM | POA: Diagnosis not present

## 2022-06-02 DIAGNOSIS — Z79899 Other long term (current) drug therapy: Secondary | ICD-10-CM

## 2022-06-02 NOTE — Progress Notes (Signed)
I,Victoria T Hamilton,acting as a scribe for Maximino Greenland, MD.,have documented all relevant documentation on the behalf of Maximino Greenland, MD,as directed by  Maximino Greenland, MD while in the presence of Maximino Greenland, MD.    Subjective:     Patient ID: Kim Newton , female    DOB: 07-27-1961 , 61 y.o.   MRN: [id number]   Chief Complaint  Patient presents with   Hyperlipidemia    HPI  Patient presents today for chol f/u. She was initially evaluated by Dr. Melony Overly, who is no longer with the practice. Due to her cholesterol levels, she was started on atorvastatin 12m MWF dosing. She has not had any issues with the medication. She is here today because she never had a f/u appt scheduled to determine if the medication is effective. She denies having any headache, chest pain and shortness of breath.    [place] She wants to know about upcoming COVID vaccination.    Hyperlipidemia This is a chronic problem. The current episode started more than 1 month ago. There are no known factors aggravating her hyperlipidemia. Pertinent negatives include no myalgias or shortness of breath. Current antihyperlipidemic treatment includes statins. Risk factors for coronary artery disease include dyslipidemia.     Past Medical History:  Diagnosis Date   Anal fistula    History of rectal abscess    Hyperlipidemia    PVC (premature ventricular contraction)    Wears glasses      Family History  Problem Relation Age of Onset   Colon cancer Mother 754  Myasthenia gravis Father      Current Outpatient Medications:    atorvastatin (LIPITOR) 10 MG tablet, Take 1 tablet by mouth on M, W, F at bedtime., Disp: 30 tablet, Rfl: 2   estradiol (CLIMARA - DOSED IN MG/24 HR) 0.075 mg/24hr patch, Place 0.075 mg onto the skin once a week., Disp: , Rfl:    levocetirizine (XYZAL) 5 MG tablet, Take 5 mg by mouth at bedtime., Disp: , Rfl:    Multiple Vitamin  (MULTIVITAMIN) tablet, Take 1 tablet by mouth daily., Disp: , Rfl:    progesterone (PROMETRIUM) 100 MG capsule, Take 100 mg by mouth at bedtime., Disp: , Rfl:    No Known Allergies   Review of Systems  Constitutional: Negative.   Respiratory: Negative.  Negative for shortness of breath.   Cardiovascular: Negative.   Musculoskeletal:  Negative for myalgias.  Neurological: Negative.   Psychiatric/Behavioral: Negative.       Today's Vitals   06/02/22 0941  BP: 122/78  Pulse: 77  Temp: 98.1 F (36.7 C)  SpO2: 98%  Weight: 117 lb 9.6 oz (53.3 kg)  Height: 5' (1.524 m)  PainSc: 0-No pain   Body mass index is 22.97 kg/m.  Wt Readings from Last 3 Encounters:  06/02/22 117 lb 9.6 oz (53.3 kg)  09/02/21 121 lb 6.4 oz (55.1 kg)  07/21/21 121 lb (54.9 kg)    Objective:  Physical Exam Vitals and nursing note reviewed.  Constitutional:      Appearance: Normal appearance.  HENT:     Head: Normocephalic and atraumatic.  Eyes:     Extraocular Movements: Extraocular movements intact.  Cardiovascular:     Rate and Rhythm: Normal rate and regular rhythm.     Heart sounds: Normal heart sounds.  Pulmonary:     Effort: Pulmonary effort is normal.     Breath sounds:  Normal breath sounds.  Musculoskeletal:     Cervical back: Normal range of motion.  Skin:    General: Skin is warm.  Neurological:     General: No focal deficit present.     Mental Status: She is alert.  Psychiatric:        Mood and Affect: Mood normal.        Behavior: Behavior normal.         Assessment And Plan:     1. Pure hypercholesterolemia Comments: Chronic, I will check fasting lipid panel and LFTs. She will rto in six months for a full physical examination. We discussed ASCVD risk as well.  - CMP14+EGFR - Lipid panel - TSH  2. PVC (premature ventricular contraction) Comments: I will check electrolytes today. Cardiology notes/studies reviewed in detail.   3. BMI 23.0-23.9, adult Comments: She is  encouraged to aim for at least 150 minutes of exercise per week.   4. Immunization due - Tdap vaccine greater than or equal to 7yo IM  5. Drug therapy - CBC no Diff   Patient was given opportunity to ask questions. Patient verbalized understanding of the plan and was able to repeat key elements of the plan. All questions were answered to their satisfaction.   I, Maximino Greenland, MD, have reviewed all documentation for this visit. The documentation on 06/02/22 for the exam, diagnosis, procedures, and orders are all accurate and complete.   IF YOU HAVE BEEN REFERRED TO A SPECIALIST, IT MAY TAKE 1-2 WEEKS TO SCHEDULE/PROCESS THE REFERRAL. IF YOU HAVE NOT HEARD FROM US/SPECIALIST IN TWO WEEKS, PLEASE GIVE [place] A CALL AT [phone].   THE PATIENT IS ENCOURAGED TO PRACTICE SOCIAL DISTANCING DUE TO THE COVID-19 PANDEMIC.

## 2022-06-02 NOTE — Patient Instructions (Addendum)
The 10-year ASCVD risk score (Arnett DK, et al., 2019) is: 6.6%   Values used to calculate the score:     Age: 61 years     Sex: Female     Is Non-Hispanic African American: Yes     Diabetic: No     Tobacco smoker: No     Systolic Blood Pressure: 122 mmHg     Is BP treated: No     HDL Cholesterol: 75 mg/dL     Total Cholesterol: 256 mg/dL   High Cholesterol  High cholesterol is a condition in which the blood has high levels of a white, waxy substance similar to fat (cholesterol). The liver makes all the cholesterol that the body needs. The human body needs small amounts of cholesterol to help build cells. A person gets extra or excess cholesterol from the food that he or she eats. The blood carries cholesterol from the liver to the rest of the body. If you have high cholesterol, deposits (plaques) may build up on the walls of your arteries. Arteries are the blood vessels that carry blood away from your heart. These plaques make the arteries narrow and stiff. Cholesterol plaques increase your risk for heart attack and stroke. Work with your health care provider to keep your cholesterol levels in a healthy range. What increases the risk? The following factors may make you more likely to develop this condition: Eating foods that are high in animal fat (saturated fat) or cholesterol. Being overweight. Not getting enough exercise. A family history of high cholesterol (familial hypercholesterolemia). Use of tobacco products. Having diabetes. What are the signs or symptoms? In most cases, high cholesterol does not usually cause any symptoms. In severe cases, very high cholesterol levels can cause: Fatty bumps under the skin (xanthomas). A white or gray ring around the black center (pupil) of the eye. How is this diagnosed? This condition may be diagnosed based on the results of a blood test. If you are older than 63 years of age, your health care provider may check your cholesterol levels  every 4-6 years. You may be checked more often if you have high cholesterol or other risk factors for heart disease. The blood test for cholesterol measures: "Bad" cholesterol, or LDL cholesterol. This is the main type of cholesterol that causes heart disease. The desired level is less than 100 mg/dL (8.93 mmol/L). "Good" cholesterol, or HDL cholesterol. HDL helps protect against heart disease by cleaning the arteries and carrying the LDL to the liver for processing. The desired level for HDL is 60 mg/dL (8.10 mmol/L) or higher. Triglycerides. These are fats that your body can store or burn for energy. The desired level is less than 150 mg/dL (1.75 mmol/L). Total cholesterol. This measures the total amount of cholesterol in your blood and includes LDL, HDL, and triglycerides. The desired level is less than 200 mg/dL (1.02 mmol/L). How is this treated? Treatment for high cholesterol starts with lifestyle changes, such as diet and exercise. Diet changes. You may be asked to eat foods that have more fiber and less saturated fats or added sugar. Lifestyle changes. These may include regular exercise, maintaining a healthy weight, and quitting use of tobacco products. Medicines. These are given when diet and lifestyle changes have not worked. You may be prescribed a statin medicine to help lower your cholesterol levels. Follow these instructions at home: Eating and drinking  Eat a healthy, balanced diet. This diet includes: Daily servings of a variety of fresh, frozen, or canned  fruits and vegetables. Daily servings of whole grain foods that are rich in fiber. Foods that are low in saturated fats and trans fats. These include poultry and fish without skin, lean cuts of meat, and low-fat dairy products. A variety of fish, especially oily fish that contain omega-3 fatty acids. Aim to eat fish at least 2 times a week. Avoid foods and drinks that have added sugar. Use healthy cooking methods, such as  roasting, grilling, broiling, baking, poaching, steaming, and stir-frying. Do not fry your food except for stir-frying. If you drink alcohol: Limit how much you have to: 0-1 drink a day for women who are not pregnant. 0-2 drinks a day for men. Know how much alcohol is in a drink. In the [place] one drink equals one 12 oz bottle of beer (355 mL), one 5 oz glass of wine (148 mL), or one 1 oz glass of hard liquor (44 mL). Lifestyle  Get regular exercise. Aim to exercise for a total of 150 minutes a week. Increase your activity level by doing activities such as gardening, walking, and taking the stairs. Do not use any products that contain nicotine or tobacco. These products include cigarettes, chewing tobacco, and vaping devices, such as e-cigarettes. If you need help quitting, ask your health care provider. General instructions Take over-the-counter and prescription medicines only as told by your health care provider. Keep all follow-up visits. This is important. Where to find more information American Heart Association: www.heart.org National Heart, Lung, and Blood Institute: https://wilson-eaton.com/ Contact a health care provider if: You have trouble achieving or maintaining a healthy diet or weight. You are starting an exercise program. You are unable to stop smoking. Get help right away if: You have chest pain. You have trouble breathing. You have discomfort or pain in your jaw, neck, back, shoulder, or arm. You have any symptoms of a stroke. "BE FAST" is an easy way to remember the main warning signs of a stroke: B - Balance. Signs are dizziness, sudden trouble walking, or loss of balance. E - Eyes. Signs are trouble seeing or a sudden change in vision. F - Face. Signs are sudden weakness or numbness of the face, or the face or eyelid drooping on one side. A - Arms. Signs are weakness or numbness in an arm. This happens suddenly and usually on one side of the body. S - Speech. Signs are  sudden trouble speaking, slurred speech, or trouble understanding what people say. T - Time. Time to call emergency services. Write down what time symptoms started. You have other signs of a stroke, such as: A sudden, severe headache with no known cause. Nausea or vomiting. Seizure. These symptoms may represent a serious problem that is an emergency. Do not wait to see if the symptoms will go away. Get medical help right away. Call your local emergency services (911 in the [place] Do not drive yourself to the hospital. Summary Cholesterol plaques increase your risk for heart attack and stroke. Work with your health care provider to keep your cholesterol levels in a healthy range. Eat a healthy, balanced diet, get regular exercise, and maintain a healthy weight. Do not use any products that contain nicotine or tobacco. These products include cigarettes, chewing tobacco, and vaping devices, such as e-cigarettes. Get help right away if you have any symptoms of a stroke. This information is not intended to replace advice given to you by your health care provider. Make sure you discuss any questions you have with your health  care provider. Document Revised: 12/05/2020 Document Reviewed: 11/25/2020 Elsevier Patient Education  [place].

## 2022-06-03 LAB — CMP14+EGFR
ALT: 8 IU/L (ref 0–32)
AST: 15 IU/L (ref 0–40)
Albumin/Globulin Ratio: 1.7 (ref 1.2–2.2)
Albumin: 4.3 g/dL (ref 3.9–4.9)
Alkaline Phosphatase: 42 IU/L — ABNORMAL LOW (ref 44–121)
BUN/Creatinine Ratio: 12 (ref 12–28)
BUN: 11 mg/dL (ref 8–27)
Bilirubin Total: 0.6 mg/dL (ref 0.0–1.2)
CO2: 23 mmol/L (ref 20–29)
Calcium: 9.1 mg/dL (ref 8.7–10.3)
Chloride: 104 mmol/L (ref 96–106)
Creatinine, Ser: 0.94 mg/dL (ref 0.57–1.00)
Globulin, Total: 2.5 g/dL (ref 1.5–4.5)
Glucose: 86 mg/dL (ref 70–99)
Potassium: 4.5 mmol/L (ref 3.5–5.2)
Sodium: 140 mmol/L (ref 134–144)
Total Protein: 6.8 g/dL (ref 6.0–8.5)
eGFR: 68 mL/min/{1.73_m2} (ref >59)

## 2022-06-03 LAB — LIPID PANEL
Chol/HDL Ratio: 2.2 ratio (ref 0.0–4.4)
Cholesterol, Total: 190 mg/dL (ref 100–199)
HDL: 88 mg/dL (ref >39)
LDL Chol Calc (NIH): 88 mg/dL (ref 0–99)
Triglycerides: 76 mg/dL (ref 0–149)
VLDL Cholesterol Cal: 14 mg/dL (ref 5–40)

## 2022-06-03 LAB — CBC
Hematocrit: 39.9 % (ref 34.0–46.6)
Hemoglobin: 13.4 g/dL (ref 11.1–15.9)
MCH: 32 pg (ref 26.6–33.0)
MCHC: 33.6 g/dL (ref 31.5–35.7)
MCV: 95 fL (ref 79–97)
Platelets: 287 10*3/uL (ref 150–450)
RBC: 4.19 x10E6/uL (ref 3.77–5.28)
RDW: 12.1 % (ref 11.7–15.4)
WBC: 3.1 10*3/uL — ABNORMAL LOW (ref 3.4–10.8)

## 2022-06-03 LAB — TSH: TSH: 2.19 u[IU]/mL (ref 0.450–4.500)

## 2022-06-04 ENCOUNTER — Encounter: Payer: Self-pay | Admitting: Internal Medicine

## 2022-06-04 ENCOUNTER — Other Ambulatory Visit: Payer: Self-pay

## 2022-06-04 DIAGNOSIS — E785 Hyperlipidemia, unspecified: Secondary | ICD-10-CM

## 2022-06-04 MED ORDER — ATORVASTATIN CALCIUM 10 MG PO TABS: ORAL_TABLET | ORAL | 2 refills | Status: DC

## 2022-07-20 ENCOUNTER — Encounter: Payer: Self-pay | Admitting: Cardiology

## 2022-08-07 ENCOUNTER — Encounter: Payer: Self-pay | Admitting: Physician Assistant

## 2022-08-07 ENCOUNTER — Ambulatory Visit: Payer: BC Managed Care – PPO | Attending: Physician Assistant | Admitting: Physician Assistant

## 2022-08-07 VITALS — BP 110/68 | HR 70 | Ht 61.0 in | Wt 121.2 lb

## 2022-08-07 DIAGNOSIS — I493 Ventricular premature depolarization: Secondary | ICD-10-CM | POA: Diagnosis not present

## 2022-08-07 NOTE — Patient Instructions (Signed)
Medication Instructions:   Your physician recommends that you continue on your current medications as directed. Please refer to the Current Medication list given to you today.   *If you need a refill on your cardiac medications before your next appointment, please call your pharmacy*   Lab Work: [place]    If you have labs (blood work) drawn today and your tests are completely normal, you will receive your results only by: [facility] (if you have MyChart) OR A paper copy in the mail If you have any lab test that is abnormal or we need to change your treatment, we will call you to review the results.   Testing/Procedures: Your physician has requested that you have an echocardiogram. Echocardiography is a painless test that uses sound waves to create images of your heart. It provides your doctor with information about the size and shape of your heart and how well your heart's chambers and valves are working. This procedure takes approximately one hour. There are no restrictions for this procedure. Please do NOT wear cologne, perfume, aftershave, or lotions (deodorant is allowed). Please arrive 15 minutes prior to your appointment time.\     Follow-Up: At [facility], you and your health needs are our priority.  As part of our continuing mission to provide you with exceptional heart care, we have created designated Provider Care Teams.  These Care Teams include your primary Cardiologist (physician) and Advanced Practice Providers (APPs -  Physician Assistants and Nurse Practitioners) who all work together to provide you with the care you need, when you need it.  We recommend signing up for the patient portal called "MyChart".  Sign up information is provided on this After Visit Summary.  MyChart is used to connect with patients for Virtual Visits (Telemedicine).  Patients are able to view lab/test results, encounter notes, upcoming appointments, etc.  Non-urgent  messages can be sent to your provider as well.   To learn more about what you can do with MyChart, go to [email or web address].    Your next appointment:   1 year(s)  The format for your next appointment:   In Person  Provider:   Lars Mage, MD    Other Instructions   Important Information About Sugar

## 2022-08-07 NOTE — Progress Notes (Signed)
Cardiology Office Note Date:  08/07/2022  Patient ID:  Kim Newton, DOB [birth year], MRN [id number] PCP:  Kim Peng, MD  Cardiologist:  Dr. Katrinka Blazing Electrophysiologist: Dr. Lalla Brothers    Chief Complaint: annual visit  History of Present Illness: Kim Newton is a 61 y.o. female with history of HLD, PVCs  She was referred to Dr. Katrinka Blazing Jan 2022 for PVCs observed at/during a colonoscopy.  No reports of syncope or palpitations.  Planned for labs/ monitoring.  22% PVC burden  Prompted referral to Dr. Lalla Brothers, saw her Feb 2022, remained quite active, no symptoms or awareness of her PVCs. Suspect to be [facility] or potentially just beneath the aortic valve within the LVOT .  Encouraged by her lack of symptoms, planned for echo and c.MRI for completeness.  TTE w/LVEF 45-50% C.MRI LVEF 50%, no LGE  F/u visit 01/14/21, remained very active, exercising regularly, discussed treatment options, with preserved LVEF and no symptoms, planned a conservative approach. This note mentions suspect RVOT  Last saw Dr. Lalla Brothers 10/17/210/17/22, continued to do well, planned updated echo and annual visit  07/30/21, TTE with stable findings, planned to periodically monitor echos.  TODAY She is doing great. She and her husband did a trip to [place], walked everywher, had a great time, got in total >100,000 steps in while there! She will get a little winded when walking in from far in the parking lot into the office carrying her backpack, bag,/books, this is not new or changing Very good exertional capacity No CP, palpitations or cardiac awareness No dizziness, near syncope or syncope.   Past Medical History:  Diagnosis Date   Anal fistula    History of rectal abscess    Hyperlipidemia    PVC (premature ventricular contraction)    Wears glasses     Past Surgical History:  Procedure Laterality Date   CESAREAN SECTION  x2 last one 1995   REPAIR UMBILICAL AND VENTRAL HERNIA'S  01-29-2004    dr gerkin   [facility]    Current Outpatient Medications  Medication Sig Dispense Refill   atorvastatin (LIPITOR) 10 MG tablet Take 1 tablet by mouth on M, W, F at bedtime. 30 tablet 2   estradiol (CLIMARA - DOSED IN MG/24 HR) 0.075 mg/24hr patch Place 0.075 mg onto the skin once a week.     levocetirizine (XYZAL) 5 MG tablet Take 5 mg by mouth at bedtime.     Multiple Vitamin (MULTIVITAMIN) tablet Take 1 tablet by mouth daily.     progesterone (PROMETRIUM) 100 MG capsule Take 100 mg by mouth at bedtime.     No current facility-administered medications for this visit.    Allergies:   Patient has no known allergies.   Social History:  The patient  reports that she has never smoked. She has never been exposed to tobacco smoke. She has never used smokeless tobacco. She reports current alcohol use of about 2.0 standard drinks of alcohol per week. She reports that she does not use drugs.   Family History:  The patient's family history includes Colon cancer (age of onset: 73) in her mother; Myasthenia gravis in her father.  ROS:  Please see the history of present illness.    All other systems are reviewed and otherwise negative.   PHYSICAL EXAM:  VS:  BP (!) 92/58   Pulse 70   Ht 5\' 1"  (1.549 m)   Wt 121 lb 3.2 oz (55 kg)   LMP 12/03/2011   SpO2 97%  BMI 22.90 kg/m  BMI: Body mass index is 22.9 kg/m. Well nourished, well developed, in no acute distress HEENT: normocephalic, atraumatic Neck: no JVD, carotid bruits or masses Cardiac:  RRR; no extrasystoles noted with prolonged auscultation, no significant murmurs, no rubs, or gallops Lungs:  CTA b/l, no wheezing, rhonchi or rales Abd: soft, nontender MS: no deformity or atrophy Ext: no edema Skin: warm and dry, no rash Neuro:  No gross deficits appreciated Psych: euthymic mood, full affect   EKG:  Done today and reviewed by myself shows  SR70bpm, no PVCs   07/30/21: TTE  1. Left ventricular ejection fraction, by estimation, is 45 to 50%.  The  left ventricle has mildly decreased function. The left ventricle  demonstrates global hypokinesis. Left ventricular diastolic parameters  were normal.   2. Right ventricular systolic function is normal. The right ventricular  size is normal. There is normal pulmonary artery systolic pressure. The  estimated right ventricular systolic pressure is 22.9 mmHg.   3. The mitral valve is normal in structure. Trivial mitral valve  regurgitation. No evidence of mitral stenosis.   4. The aortic valve is normal in structure. Aortic valve regurgitation is  mild. No aortic stenosis is present.   5. The inferior vena cava is normal in size with greater than 50%  respiratory variability, suggesting right atrial pressure of 3 mmHg.    01/07/21: c.MRI IMPRESSION: 1. Mild left ventricular dilation, with LVEDD 55 mm, and LVEDVi 89 mL/m2.   2. Mildly reduced left ventricular systolic function (LVEF =50%). There is mild global hypokinesis with no regional wall motion abnormalities.   3. There is no late gadolinium enhancement in the left ventricular myocardium.   12/12/20: TTE 1. LVEF 45-50% with diffuse hypokinesis. LVEF evaluation can be  underestimated by frequent PVCs in a pattern of bigeminy.   2. Left ventricular ejection fraction, by estimation, is 45 to 50%. The  left ventricle has mildly decreased function. The left ventricle  demonstrates global hypokinesis. Left ventricular diastolic parameters are  consistent with Grade II diastolic  dysfunction (pseudonormalization).   3. Right ventricular systolic function is normal. The right ventricular  size is normal.   4. The mitral valve is normal in structure. Mild mitral valve  regurgitation. No evidence of mitral stenosis.   5. The aortic valve is normal in structure. Aortic valve regurgitation is  mild. No aortic stenosis is present.   6. The inferior vena cava is normal in size with greater than 50%  respiratory variability, suggesting  right atrial pressure of 3 mmHg.    11/2020; Monitor Basic rhythm is NSR with Ave HR 90 bpm (64-187 bpm) Freqent Ventricular premature beats with PVC burden 21.7% Asymptomatic 4 beat VT salvo at 187 bpm.   Abnormal due to Ventricular Ectopy burden 22%  Recent Labs: 06/02/2022: ALT 8; BUN 11; Creatinine, Ser 0.94; Hemoglobin 13.4; Platelets 287; Potassium 4.5; Sodium 140; TSH 2.190  06/02/2022: Chol/HDL Ratio 2.2; Cholesterol, Total 190; HDL 88; LDL Chol Calc (NIH) 88; Triglycerides 76   CrCl cannot be calculated (Patient's most recent lab result is older than the maximum 21 days allowed.).   Wt Readings from Last 3 Encounters:  08/07/22 121 lb 3.2 oz (55 kg)  06/02/22 117 lb 9.6 oz (53.3 kg)  09/02/21 121 lb 6.4 oz (55.1 kg)     Other studies reviewed: Additional studies/records reviewed today include: summarized above  ASSESSMENT AND PLAN:  PVCs No symptoms  Will go ahead and check an echo  Disposition: F/u with [place] in a year again, sooner if needed and pending her echo  Current medicines are reviewed at length with the patient today.  The patient did not have any concerns regarding medicines.  Venetia Night, PA-C 08/07/2022 2:49 PM     [address] [place] [address] [phone] (office)  [phone] (fax)

## 2022-08-18 ENCOUNTER — Encounter: Payer: Self-pay | Admitting: Cardiology

## 2022-08-29 ENCOUNTER — Other Ambulatory Visit: Payer: Self-pay | Admitting: Internal Medicine

## 2022-08-29 DIAGNOSIS — E785 Hyperlipidemia, unspecified: Secondary | ICD-10-CM

## 2022-09-04 ENCOUNTER — Encounter: Payer: Self-pay | Admitting: Internal Medicine

## 2022-09-08 ENCOUNTER — Other Ambulatory Visit (HOSPITAL_COMMUNITY): Payer: BC Managed Care – PPO

## 2022-09-18 ENCOUNTER — Ambulatory Visit (HOSPITAL_COMMUNITY): Payer: BC Managed Care – PPO | Attending: Physician Assistant

## 2022-09-18 DIAGNOSIS — I493 Ventricular premature depolarization: Secondary | ICD-10-CM

## 2022-09-18 LAB — ECHOCARDIOGRAM COMPLETE
Area-P 1/2: 3.81 cm2
S' Lateral: 3.2 cm

## 2022-10-07 ENCOUNTER — Encounter: Payer: Self-pay | Admitting: Cardiology

## 2022-12-17 ENCOUNTER — Encounter: Payer: BC Managed Care – PPO | Admitting: Internal Medicine

## 2022-12-30 ENCOUNTER — Ambulatory Visit (INDEPENDENT_AMBULATORY_CARE_PROVIDER_SITE_OTHER): Payer: BC Managed Care – PPO | Admitting: Internal Medicine

## 2022-12-30 ENCOUNTER — Encounter: Payer: Self-pay | Admitting: Internal Medicine

## 2022-12-30 VITALS — BP 120/68 | HR 85 | Temp 98.2°F | Ht 61.0 in | Wt 121.2 lb

## 2022-12-30 DIAGNOSIS — Z124 Encounter for screening for malignant neoplasm of cervix: Secondary | ICD-10-CM | POA: Diagnosis not present

## 2022-12-30 DIAGNOSIS — Z0000 Encounter for general adult medical examination without abnormal findings: Secondary | ICD-10-CM | POA: Diagnosis not present

## 2022-12-30 DIAGNOSIS — E2839 Other primary ovarian failure: Secondary | ICD-10-CM

## 2022-12-30 DIAGNOSIS — Z1151 Encounter for screening for human papillomavirus (HPV): Secondary | ICD-10-CM | POA: Diagnosis not present

## 2022-12-30 DIAGNOSIS — E7800 Pure hypercholesterolemia, unspecified: Secondary | ICD-10-CM | POA: Diagnosis not present

## 2022-12-30 DIAGNOSIS — Z01419 Encounter for gynecological examination (general) (routine) without abnormal findings: Secondary | ICD-10-CM | POA: Diagnosis not present

## 2022-12-30 DIAGNOSIS — H6122 Impacted cerumen, left ear: Secondary | ICD-10-CM | POA: Diagnosis not present

## 2022-12-30 DIAGNOSIS — Z6823 Body mass index (BMI) 23.0-23.9, adult: Secondary | ICD-10-CM | POA: Diagnosis not present

## 2022-12-30 DIAGNOSIS — K644 Residual hemorrhoidal skin tags: Secondary | ICD-10-CM

## 2022-12-30 DIAGNOSIS — Z1231 Encounter for screening mammogram for malignant neoplasm of breast: Secondary | ICD-10-CM | POA: Diagnosis not present

## 2022-12-30 DIAGNOSIS — Z0001 Encounter for general adult medical examination with abnormal findings: Secondary | ICD-10-CM

## 2022-12-30 MED ORDER — HYDROCORTISONE ACETATE 1 % EX OINT: TOPICAL_OINTMENT | CUTANEOUS | 0 refills | Status: DC

## 2022-12-30 NOTE — Patient Instructions (Addendum)
Drink 2 cups of water after drinking coffee I am sending a prescription for hemorrhoid cream Increase fruit/veggie intake Invest in air purifier Saline nasal spray to flush nose  High Cholesterol  High cholesterol is a condition in which the blood has high levels of a white, waxy substance similar to fat (cholesterol). The liver makes all the cholesterol that the body needs. The human body needs small amounts of cholesterol to help build cells. A person gets extra or excess cholesterol from the food that he or she eats. The blood carries cholesterol from the liver to the rest of the body. If you have high cholesterol, deposits (plaques) may build up on the walls of your arteries. Arteries are the blood vessels that carry blood away from your heart. These plaques make the arteries narrow and stiff. Cholesterol plaques increase your risk for heart attack and stroke. Work with your health care provider to keep your cholesterol levels in a healthy range. What increases the risk? The following factors may make you more likely to develop this condition: Eating foods that are high in animal fat (saturated fat) or cholesterol. Being overweight. Not getting enough exercise. A family history of high cholesterol (familial hypercholesterolemia). Use of tobacco products. Having diabetes. What are the signs or symptoms? In most cases, high cholesterol does not usually cause any symptoms. In severe cases, very high cholesterol levels can cause: Fatty bumps under the skin (xanthomas). A white or gray ring around the black center (pupil) of the eye. How is this diagnosed? This condition may be diagnosed based on the results of a blood test. If you are older than 64 years of age, your health care provider may check your cholesterol levels every 4-6 years. You may be checked more often if you have high cholesterol or other risk factors for heart disease. The blood test for cholesterol measures: "Bad"  cholesterol, or LDL cholesterol. This is the main type of cholesterol that causes heart disease. The desired level is less than 100 mg/dL (2.59 mmol/L). "Good" cholesterol, or HDL cholesterol. HDL helps protect against heart disease by cleaning the arteries and carrying the LDL to the liver for processing. The desired level for HDL is 60 mg/dL (1.55 mmol/L) or higher. Triglycerides. These are fats that your body can store or burn for energy. The desired level is less than 150 mg/dL (1.69 mmol/L). Total cholesterol. This measures the total amount of cholesterol in your blood and includes LDL, HDL, and triglycerides. The desired level is less than 200 mg/dL (5.17 mmol/L). How is this treated? Treatment for high cholesterol starts with lifestyle changes, such as diet and exercise. Diet changes. You may be asked to eat foods that have more fiber and less saturated fats or added sugar. Lifestyle changes. These may include regular exercise, maintaining a healthy weight, and quitting use of tobacco products. Medicines. These are given when diet and lifestyle changes have not worked. You may be prescribed a statin medicine to help lower your cholesterol levels. Follow these instructions at home: Eating and drinking  Eat a healthy, balanced diet. This diet includes: Daily servings of a variety of fresh, frozen, or canned fruits and vegetables. Daily servings of whole grain foods that are rich in fiber. Foods that are low in saturated fats and trans fats. These include poultry and fish without skin, lean cuts of meat, and low-fat dairy products. A variety of fish, especially oily fish that contain omega-3 fatty acids. Aim to eat fish at least 2 times a  week. Avoid foods and drinks that have added sugar. Use healthy cooking methods, such as roasting, grilling, broiling, baking, poaching, steaming, and stir-frying. Do not fry your food except for stir-frying. If you drink alcohol: Limit how much you have  to: 0-1 drink a day for women who are not pregnant. 0-2 drinks a day for men. Know how much alcohol is in a drink. In the [place] one drink equals one 12 oz bottle of beer (355 mL), one 5 oz glass of wine (148 mL), or one 1 oz glass of hard liquor (44 mL). Lifestyle  Get regular exercise. Aim to exercise for a total of 150 minutes a week. Increase your activity level by doing activities such as gardening, walking, and taking the stairs. Do not use any products that contain nicotine or tobacco. These products include cigarettes, chewing tobacco, and vaping devices, such as e-cigarettes. If you need help quitting, ask your health care provider. General instructions Take over-the-counter and prescription medicines only as told by your health care provider. Keep all follow-up visits. This is important. Where to find more information American Heart Association: www.heart.org National Heart, Lung, and Blood Institute: https://wilson-eaton.com/ Contact a health care provider if: You have trouble achieving or maintaining a healthy diet or weight. You are starting an exercise program. You are unable to stop smoking. Get help right away if: You have chest pain. You have trouble breathing. You have discomfort or pain in your jaw, neck, back, shoulder, or arm. You have any symptoms of a stroke. "BE FAST" is an easy way to remember the main warning signs of a stroke: B - Balance. Signs are dizziness, sudden trouble walking, or loss of balance. E - Eyes. Signs are trouble seeing or a sudden change in vision. F - Face. Signs are sudden weakness or numbness of the face, or the face or eyelid drooping on one side. A - Arms. Signs are weakness or numbness in an arm. This happens suddenly and usually on one side of the body. S - Speech. Signs are sudden trouble speaking, slurred speech, or trouble understanding what people say. T - Time. Time to call emergency services. Write down what time symptoms started. You  have other signs of a stroke, such as: A sudden, severe headache with no known cause. Nausea or vomiting. Seizure. These symptoms may represent a serious problem that is an emergency. Do not wait to see if the symptoms will go away. Get medical help right away. Call your local emergency services (911 in the [place] Do not drive yourself to the hospital. Summary Cholesterol plaques increase your risk for heart attack and stroke. Work with your health care provider to keep your cholesterol levels in a healthy range. Eat a healthy, balanced diet, get regular exercise, and maintain a healthy weight. Do not use any products that contain nicotine or tobacco. These products include cigarettes, chewing tobacco, and vaping devices, such as e-cigarettes. Get help right away if you have any symptoms of a stroke. This information is not intended to replace advice given to you by your health care provider. Make sure you discuss any questions you have with your health care provider. Document Revised: 04/24/2022 Document Reviewed: 11/25/2020 Elsevier Patient Education  [employer].

## 2022-12-30 NOTE — Progress Notes (Signed)
Hershal Coria Martin,acting as a Neurosurgeon for Gwynneth Aliment, MD.,have documented all relevant documentation on the behalf of Gwynneth Aliment, MD,as directed by  Gwynneth Aliment, MD while in the presence of Gwynneth Aliment, MD.   Subjective:     Patient ID: Kim Newton , female    DOB: July 20, 1961 , 62 y.o.   MRN: [id number]   Chief Complaint  Patient presents with   Annual Exam   Hyperlipidemia    HPI  Patient presents today for annual exam. She reports compliance with medications. Patient states she has hemorrhoid  and wants to know what she can do about it.  She states it doesn't bother her but she feels as if she can't get clean enough. Patient also states her allergies are bothering her more these days. Patient goes to Dr.Megan Morris for her pelvic exams.  She is scheduled for pap/mammogram later today.   BP Readings from Last 3 Encounters: 12/30/22 : 120/68 08/07/22 : 110/68 06/02/22 : 122/78       Past Medical History:  Diagnosis Date   Anal fistula    History of rectal abscess    Hyperlipidemia    PVC (premature ventricular contraction)    Wears glasses      Family History  Problem Relation Age of Onset   Colon cancer Mother 49   Myasthenia gravis Father      Current Outpatient Medications:    atorvastatin (LIPITOR) 10 MG tablet, TAKE 1 TABLET BY MOUTH ON M, W, F AT BEDTIME., Disp: 90 tablet, Rfl: 1   estradiol (CLIMARA - DOSED IN MG/24 HR) 0.075 mg/24hr patch, Place 0.075 mg onto the skin once a week., Disp: , Rfl:    Hydrocortisone Acetate 1 % OINT, Apply to affected area tid prn, Disp: 28 g, Rfl: 0   levocetirizine (XYZAL) 5 MG tablet, Take 5 mg by mouth at bedtime., Disp: , Rfl:    Multiple Vitamin (MULTIVITAMIN) tablet, Take 1 tablet by mouth daily., Disp: , Rfl:    progesterone (PROMETRIUM) 100 MG capsule, Take 100 mg by mouth at bedtime., Disp: , Rfl:    No Known Allergies    The patient states she uses post menopausal status for birth control. Last  LMP was Patient's last menstrual period was 12/03/2011.. Negative for Dysmenorrhea. Negative for: breast discharge, breast lump(s), breast pain and breast self exam. Associated symptoms include abnormal vaginal bleeding. Pertinent negatives include abnormal bleeding (hematology), anxiety, decreased libido, depression, difficulty falling sleep, dyspareunia, history of infertility, nocturia, sexual dysfunction, sleep disturbances, urinary incontinence, urinary urgency, vaginal discharge and vaginal itching. Diet regular.    . The patient's tobacco use is:  Social History   Tobacco Use  Smoking Status Never   Passive exposure: Never  Smokeless Tobacco Never  . She has been exposed to passive smoke. The patient's alcohol use is:  Social History   Substance and Sexual Activity  Alcohol Use Yes   Alcohol/week: 2.0 standard drinks of alcohol   Types: 2 Glasses of wine per week   Comment: occasional    Review of Systems  Constitutional: Negative.   HENT: Negative.    Eyes: Negative.   Respiratory: Negative.    Cardiovascular: Negative.   Endocrine: Negative.   Genitourinary: Negative.   Musculoskeletal: Negative.   Skin: Negative.   Allergic/Immunologic: Negative.   Neurological: Negative.   Hematological: Negative.   Psychiatric/Behavioral: Negative.       Today's Vitals   12/30/22 1127  BP: 120/68  Pulse:  85  Temp: 98.2 F (36.8 C)  TempSrc: Oral  Weight: 121 lb 3.2 oz (55 kg)  Height: 5\' 1"  (1.549 m)  PainSc: 0-No pain   Body mass index is 22.9 kg/m.  Wt Readings from Last 3 Encounters:  12/30/22 121 lb 3.2 oz (55 kg)  08/07/22 121 lb 3.2 oz (55 kg)  06/02/22 117 lb 9.6 oz (53.3 kg)    Objective:  Physical Exam Vitals and nursing note reviewed.  Constitutional:      Appearance: Normal appearance.  HENT:     Head: Normocephalic and atraumatic.     Right Ear: Tympanic membrane, ear canal and external ear normal.     Left Ear: Ear canal and external ear normal.  There is impacted cerumen.     Nose:     Comments: Masked     Mouth/Throat:     Comments: Masked  Eyes:     Extraocular Movements: Extraocular movements intact.     Conjunctiva/sclera: Conjunctivae normal.     Pupils: Pupils are equal, round, and reactive to light.  Cardiovascular:     Rate and Rhythm: Normal rate and regular rhythm.     Pulses: Normal pulses.     Heart sounds: Normal heart sounds.  Pulmonary:     Effort: Pulmonary effort is normal.     Breath sounds: Normal breath sounds.  Abdominal:     General: Abdomen is flat. Bowel sounds are normal.     Palpations: Abdomen is soft.  Genitourinary:    Comments: deferred Musculoskeletal:        General: Normal range of motion.     Cervical back: Normal range of motion and neck supple.  Skin:    General: Skin is warm and dry.  Neurological:     General: No focal deficit present.     Mental Status: She is alert and oriented to person, place, and time.  Psychiatric:        Mood and Affect: Mood normal.        Behavior: Behavior normal.     Assessment And Plan:     1. Routine general medical examination at a health care facility Comments: A full exam was performed. Importance of monthly self breast exams was discussed with the patient.  PATIENT IS ADVISED TO GET 30-45 MINUTES REGULAR EXERCISE NO LESS THAN FOUR TO FIVE DAYS PER WEEK - BOTH WEIGHTBEARING EXERCISES AND AEROBIC ARE RECOMMENDED.  PATIENT IS ADVISED TO FOLLOW A HEALTHY DIET WITH AT LEAST SIX FRUITS/VEGGIES PER DAY, DECREASE INTAKE OF RED MEAT, AND TO INCREASE FISH INTAKE TO TWO DAYS PER WEEK.  MEATS/FISH SHOULD NOT BE FRIED, BAKED OR BROILED IS PREFERABLE.  IT IS ALSO IMPORTANT TO CUT BACK ON YOUR SUGAR INTAKE. PLEASE AVOID ANYTHING WITH ADDED SUGAR, CORN SYRUP OR OTHER SWEETENERS. IF YOU MUST USE A SWEETENER, YOU CAN TRY STEVIA. IT IS ALSO IMPORTANT TO AVOID ARTIFICIALLY SWEETENERS AND DIET BEVERAGES. LASTLY, I SUGGEST WEARING SPF 50 SUNSCREEN ON EXPOSED PARTS AND  ESPECIALLY WHEN IN THE DIRECT SUNLIGHT FOR AN EXTENDED PERIOD OF TIME.  PLEASE AVOID FAST FOOD RESTAURANTS AND INCREASE YOUR WATER INTAKE. - CBC - CMP14+EGFR - Lipid panel - Magnesium  2. Pure hypercholesterolemia Comments: Chronic, currently on atorvastatin 10mg  w/ MWF dosing. She is also followed by Cardiology. Per Cardiology, LDL goal < 100. Calcium score is ZERO.  3. External hemorrhoid Comments: Per patient request, pt did not receive rectal exam. She has GYN exam scheduled for later today.  I will send  rx Anusol cream.  4. Left ear impacted cerumen Comments: After obtaining verbal consent, left ear was flushed by irrigation w/o complications. No TM abnormality was noted.  5. Estrogen deficiency Comments: I will request a copy of her most recent bone density from GYN, DR. Megan Morris. Last one documented in 2019 was POS for osteoporosis.  Patient was given opportunity to ask questions. Patient verbalized understanding of the plan and was able to repeat key elements of the plan. All questions were answered to their satisfaction.    I, Gwynneth Aliment, MD, have reviewed all documentation for this visit. The documentation on 12/30/22 for the exam, diagnosis, procedures, and orders are all accurate and complete.  THE PATIENT IS ENCOURAGED TO PRACTICE SOCIAL DISTANCING DUE TO THE COVID-19 PANDEMIC.

## 2022-12-31 LAB — CMP14+EGFR
ALT: 14 IU/L (ref 0–32)
AST: 15 IU/L (ref 0–40)
Albumin/Globulin Ratio: 1.5 (ref 1.2–2.2)
Albumin: 4.3 g/dL (ref 3.9–4.9)
Alkaline Phosphatase: 50 IU/L (ref 44–121)
BUN/Creatinine Ratio: 14 (ref 12–28)
BUN: 11 mg/dL (ref 8–27)
Bilirubin Total: 0.6 mg/dL (ref 0.0–1.2)
CO2: 24 mmol/L (ref 20–29)
Calcium: 9.3 mg/dL (ref 8.7–10.3)
Chloride: 104 mmol/L (ref 96–106)
Creatinine, Ser: 0.79 mg/dL (ref 0.57–1.00)
Globulin, Total: 2.8 g/dL (ref 1.5–4.5)
Glucose: 83 mg/dL (ref 70–99)
Potassium: 4.7 mmol/L (ref 3.5–5.2)
Sodium: 141 mmol/L (ref 134–144)
Total Protein: 7.1 g/dL (ref 6.0–8.5)
eGFR: 83 mL/min/{1.73_m2} (ref >59)

## 2022-12-31 LAB — LIPID PANEL
Chol/HDL Ratio: 2 ratio (ref 0.0–4.4)
Cholesterol, Total: 191 mg/dL (ref 100–199)
HDL: 95 mg/dL (ref >39)
LDL Chol Calc (NIH): 83 mg/dL (ref 0–99)
Triglycerides: 70 mg/dL (ref 0–149)
VLDL Cholesterol Cal: 13 mg/dL (ref 5–40)

## 2022-12-31 LAB — CBC
Hematocrit: 41.5 % (ref 34.0–46.6)
Hemoglobin: 13.8 g/dL (ref 11.1–15.9)
MCH: 31.8 pg (ref 26.6–33.0)
MCHC: 33.3 g/dL (ref 31.5–35.7)
MCV: 96 fL (ref 79–97)
Platelets: 277 10*3/uL (ref 150–450)
RBC: 4.34 x10E6/uL (ref 3.77–5.28)
RDW: 12 % (ref 11.7–15.4)
WBC: 3.4 10*3/uL (ref 3.4–10.8)

## 2022-12-31 LAB — MAGNESIUM: Magnesium: 2.2 mg/dL (ref 1.6–2.3)

## 2023-02-08 ENCOUNTER — Encounter: Payer: Self-pay | Admitting: Internal Medicine

## 2023-03-04 ENCOUNTER — Encounter: Payer: Self-pay | Admitting: Internal Medicine

## 2023-05-17 ENCOUNTER — Encounter: Payer: Self-pay | Admitting: Internal Medicine

## 2023-05-19 ENCOUNTER — Other Ambulatory Visit: Payer: Self-pay | Admitting: Internal Medicine

## 2023-05-19 MED ORDER — AZITHROMYCIN 500 MG PO TABS: ORAL_TABLET | ORAL | 0 refills | Status: DC

## 2023-07-05 ENCOUNTER — Ambulatory Visit (INDEPENDENT_AMBULATORY_CARE_PROVIDER_SITE_OTHER): Payer: BC Managed Care – PPO | Admitting: Internal Medicine

## 2023-07-05 ENCOUNTER — Encounter: Payer: Self-pay | Admitting: Internal Medicine

## 2023-07-05 VITALS — BP 102/60 | HR 77 | Temp 98.6°F | Ht 61.0 in | Wt 124.0 lb

## 2023-07-05 DIAGNOSIS — J3089 Other allergic rhinitis: Secondary | ICD-10-CM

## 2023-07-05 DIAGNOSIS — M5441 Lumbago with sciatica, right side: Secondary | ICD-10-CM

## 2023-07-05 DIAGNOSIS — G8929 Other chronic pain: Secondary | ICD-10-CM | POA: Diagnosis not present

## 2023-07-05 DIAGNOSIS — Z2821 Immunization not carried out because of patient refusal: Secondary | ICD-10-CM

## 2023-07-05 DIAGNOSIS — E7800 Pure hypercholesterolemia, unspecified: Secondary | ICD-10-CM | POA: Diagnosis not present

## 2023-07-05 MED ORDER — MONTELUKAST SODIUM 10 MG PO TABS: 10.0000 mg | ORAL_TABLET | Freq: Every day | ORAL | 2 refills | Status: DC

## 2023-07-05 NOTE — Assessment & Plan Note (Signed)
I initially suggested Astelin spray; however, she does not wish to use a nasal spray.  I will send rx montelukast, she has taken this in the past.  She will start taking this in the mornings. If ineffective, I do suggest starting the nasal spray. Also encouraged to limit her dairy intake.

## 2023-07-05 NOTE — Assessment & Plan Note (Addendum)
Chronic, currently taking atorvastatin 10mg  with MWF dosing. She is encouraged to follow a heart healthy lifestyle. She will f/u in April 2025 for her physical examination.

## 2023-07-05 NOTE — Progress Notes (Signed)
I,Jameka J Llittleton, CMA,acting as a Neurosurgeon for Gwynneth Aliment, MD.,have documented all relevant documentation on the behalf of Gwynneth Aliment, MD,as directed by  Gwynneth Aliment, MD while in the presence of Gwynneth Aliment, MD.  Subjective:  Patient ID: Kim Newton , female    DOB: 11-26-1960 , 62 y.o.   MRN: [id number]  Chief Complaint  Patient presents with   Hyperlipidemia    HPI  Patient presents today for chol f/u. Patient reports compliance with her meds. She denies having any headache, chest pain and shortness of breath. Patient complains her allergies are real bad. She is taking xyzal at night and her allergies aren't improving.      Hyperlipidemia This is a chronic problem. The current episode started more than 1 month ago. There are no known factors aggravating her hyperlipidemia. Current antihyperlipidemic treatment includes statins. Risk factors for coronary artery disease include dyslipidemia.     Past Medical History:  Diagnosis Date   Anal fistula    History of rectal abscess    Hyperlipidemia    PVC (premature ventricular contraction)    Wears glasses      Family History  Problem Relation Age of Onset   Colon cancer Mother 38   Myasthenia gravis Father      Current Outpatient Medications:    atorvastatin (LIPITOR) 10 MG tablet, TAKE 1 TABLET BY MOUTH ON M, W, F AT BEDTIME., Disp: 90 tablet, Rfl: 1   estradiol (CLIMARA - DOSED IN MG/24 HR) 0.075 mg/24hr patch, Place 0.075 mg onto the skin once a week., Disp: , Rfl:    levocetirizine (XYZAL) 5 MG tablet, Take 5 mg by mouth at bedtime., Disp: , Rfl:    montelukast (SINGULAIR) 10 MG tablet, Take 1 tablet (10 mg total) by mouth daily., Disp: 30 tablet, Rfl: 2   Multiple Vitamin (MULTIVITAMIN) tablet, Take 1 tablet by mouth daily., Disp: , Rfl:    progesterone (PROMETRIUM) 100 MG capsule, Take 100 mg by mouth at bedtime., Disp: , Rfl:    Hydrocortisone Acetate 1 % OINT, Apply to affected area tid prn  (Patient not taking: Reported on 07/05/2023), Disp: 28 g, Rfl: 0   No Known Allergies   Review of Systems  Constitutional: Negative.   Eyes: Negative.   Respiratory: Negative.    Cardiovascular: Negative.   Gastrointestinal: Negative.   Musculoskeletal:  Positive for back pain.       She c/o occasional LBP with radiation into her right buttocks. She thinks that her sx are precipitated by sitting incorrectly. Denies RLE weakness and paresthesias.   Skin: Negative.   Neurological: Negative.   Psychiatric/Behavioral: Negative.       Today's Vitals   07/05/23 0838  BP: 102/60  Pulse: 77  Temp: 98.6 F (37 C)  Weight: 124 lb (56.2 kg)  Height: 5\' 1"  (1.549 m)  PainSc: 0-No pain   Body mass index is 23.43 kg/m.  Wt Readings from Last 3 Encounters:  07/05/23 124 lb (56.2 kg)  12/30/22 121 lb 3.2 oz (55 kg)  08/07/22 121 lb 3.2 oz (55 kg)    The 10-year ASCVD risk score (Arnett DK, et al., 2019) is: 3.4%   Values used to calculate the score:     Age: 2 years     Sex: Female     Is Non-Hispanic African American: Yes     Diabetic: No     Tobacco smoker: No     Systolic Blood Pressure: 102 mmHg  Is BP treated: No     HDL Cholesterol: 95 mg/dL     Total Cholesterol: 191 mg/dL  Objective:  Physical Exam Vitals and nursing note reviewed.  Constitutional:      Appearance: Normal appearance.  HENT:     Head: Normocephalic and atraumatic.  Eyes:     Extraocular Movements: Extraocular movements intact.  Cardiovascular:     Rate and Rhythm: Normal rate and regular rhythm.     Heart sounds: Normal heart sounds.  Pulmonary:     Effort: Pulmonary effort is normal.     Breath sounds: Normal breath sounds.  Musculoskeletal:        General: Tenderness present.     Cervical back: Normal range of motion.  Skin:    General: Skin is warm.  Neurological:     General: No focal deficit present.     Mental Status: She is alert.  Psychiatric:        Mood and Affect: Mood  normal.        Behavior: Behavior normal.         Assessment And Plan:  Pure hypercholesterolemia Assessment & Plan: Chronic, currently taking atorvastatin 10mg  with MWF dosing. She is encouraged to follow a heart healthy lifestyle. She will f/u in April 2025 for her physical examination.   Orders: -     Lipid panel -     CMP14+EGFR  Non-seasonal allergic rhinitis due to other allergic trigger Assessment & Plan: I initially suggested Astelin spray; however, she does not wish to use a nasal spray.  I will send rx montelukast, she has taken this in the past.  She will start taking this in the mornings. If ineffective, I do suggest starting the nasal spray. Also encouraged to limit her dairy intake.    Chronic right-sided low back pain with right-sided sciatica Assessment & Plan: She is advised to use lidocaine patch prn. She was also given some stretching exercises to perform. Advised to also Google hip-opening exercises. She was given rx therapeutic massage. She will let me know if her sx persist.    Influenza vaccination declined  Other orders -     Montelukast Sodium; Take 1 tablet (10 mg total) by mouth daily.  Dispense: 30 tablet; Refill: 2    Return for keep next appt as scheduled .  Patient was given opportunity to ask questions. Patient verbalized understanding of the plan and was able to repeat key elements of the plan. All questions were answered to their satisfaction.    I, Gwynneth Aliment, MD, have reviewed all documentation for this visit. The documentation on 07/05/23 for the exam, diagnosis, procedures, and orders are all accurate and complete.   IF YOU HAVE BEEN REFERRED TO A SPECIALIST, IT MAY TAKE 1-2 WEEKS TO SCHEDULE/PROCESS THE REFERRAL. IF YOU HAVE NOT HEARD FROM US/SPECIALIST IN TWO WEEKS, PLEASE GIVE [place] A CALL AT [phone].

## 2023-07-05 NOTE — Assessment & Plan Note (Signed)
She is advised to use lidocaine patch prn. She was also given some stretching exercises to perform. Advised to also Google hip-opening exercises. She was given rx therapeutic massage. She will let me know if her sx persist.

## 2023-07-06 LAB — CMP14+EGFR
ALT: 11 [IU]/L (ref 0–32)
AST: 16 [IU]/L (ref 0–40)
Albumin: 4.2 g/dL (ref 3.9–4.9)
Alkaline Phosphatase: 45 [IU]/L (ref 44–121)
BUN/Creatinine Ratio: 14 (ref 12–28)
BUN: 13 mg/dL (ref 8–27)
Bilirubin Total: 0.6 mg/dL (ref 0.0–1.2)
CO2: 24 mmol/L (ref 20–29)
Calcium: 9.1 mg/dL (ref 8.7–10.3)
Chloride: 105 mmol/L (ref 96–106)
Creatinine, Ser: 0.95 mg/dL (ref 0.57–1.00)
Globulin, Total: 2.7 g/dL (ref 1.5–4.5)
Glucose: 84 mg/dL (ref 70–99)
Potassium: 4.5 mmol/L (ref 3.5–5.2)
Sodium: 140 mmol/L (ref 134–144)
Total Protein: 6.9 g/dL (ref 6.0–8.5)
eGFR: 67 mL/min/{1.73_m2} (ref >59)

## 2023-07-06 LAB — LIPID PANEL
Chol/HDL Ratio: 2.1 {ratio} (ref 0.0–4.4)
Cholesterol, Total: 190 mg/dL (ref 100–199)
HDL: 89 mg/dL (ref >39)
LDL Chol Calc (NIH): 88 mg/dL (ref 0–99)
Triglycerides: 73 mg/dL (ref 0–149)
VLDL Cholesterol Cal: 13 mg/dL (ref 5–40)

## 2023-07-13 ENCOUNTER — Encounter: Payer: Self-pay | Admitting: Internal Medicine

## 2023-07-15 ENCOUNTER — Encounter: Payer: Self-pay | Admitting: Internal Medicine

## 2023-08-01 NOTE — Progress Notes (Unsigned)
  Electrophysiology Office Note:   Date:  08/02/2023  ID:  Aren P [place] Newton, DOB March 09, 1961, MRN [id number]  Primary Cardiologist: Lesleigh Noe, MD (Inactive) Electrophysiologist: Lanier Prude, MD      History of Present Illness:   Kim Newton is a 62 y.o. female with h/o HLD and PVCs seen today for routine electrophysiology followup.   Since last being seen in our clinic the patient reports doing very well.  she denies chest pain, palpitations, dyspnea, PND, orthopnea, nausea, vomiting, dizziness, syncope, edema, weight gain, or early satiety.   Review of systems complete and found to be negative unless listed in HPI.   EP Information / Studies Reviewed:    EKG is ordered today. Personal review as below.  EKG Interpretation Date/Time:  Monday August 02 2023 08:54:48 EDT Ventricular Rate:  78 PR Interval:  206 QRS Duration:  76 QT Interval:  408 QTC Calculation: 465 R Axis:   71  Text Interpretation: Sinus rhythm with frequent Premature ventricular complexes in a pattern of bigeminy Possible Left atrial enlargement Reconfirmed by Maxine Glenn [id number]) on 08/02/2023 8:56:50 AM    Echo 09/18/2022 LVEF 55-60%, grade 1 DD  Physical Exam:   VS:  BP 120/68   Pulse 78   Ht 5\' 1"  (1.549 m)   Wt 126 lb (57.2 kg)   LMP 12/03/2011   SpO2 95%   BMI 23.81 kg/m    Wt Readings from Last 3 Encounters:  08/02/23 126 lb (57.2 kg)  07/05/23 124 lb (56.2 kg)  12/30/22 121 lb 3.2 oz (55 kg)     GEN: Well nourished, well developed in no acute distress NECK: No JVD; No carotid bruits CARDIAC: Regular rate and rhythm with occasional ectopy, no murmurs, rubs, gallops RESPIRATORY:  Clear to auscultation without rales, wheezing or rhonchi  ABDOMEN: Soft, non-tender, non-distended EXTREMITIES:  No edema; No deformity   ASSESSMENT AND PLAN:    PVCs Echo 09/18/2022 LVEF 55/60%, Grade 1 DD, Trivial MR, Trivial AI Asymptomatic EKG with bigeminy, but pulse monitored throughout  exam with only 1 PVC In shared decision making will continue conservative management.   She knows to call for monitoring/work up if she has any new SOB, fatigue, dizziness, or edema.    Follow up with Dr. Lalla Brothers in 12 months  Signed, Graciella Freer, PA-C

## 2023-08-02 ENCOUNTER — Ambulatory Visit: Payer: BC Managed Care – PPO | Attending: Student | Admitting: Student

## 2023-08-02 ENCOUNTER — Encounter: Payer: Self-pay | Admitting: Student

## 2023-08-02 VITALS — BP 120/68 | HR 78 | Ht 61.0 in | Wt 126.0 lb

## 2023-08-02 DIAGNOSIS — E7800 Pure hypercholesterolemia, unspecified: Secondary | ICD-10-CM

## 2023-08-02 DIAGNOSIS — E785 Hyperlipidemia, unspecified: Secondary | ICD-10-CM | POA: Diagnosis not present

## 2023-08-02 DIAGNOSIS — I493 Ventricular premature depolarization: Secondary | ICD-10-CM

## 2023-08-02 NOTE — Patient Instructions (Signed)

## 2023-09-04 ENCOUNTER — Other Ambulatory Visit: Payer: Self-pay | Admitting: Internal Medicine

## 2023-09-04 DIAGNOSIS — E785 Hyperlipidemia, unspecified: Secondary | ICD-10-CM

## 2023-09-29 ENCOUNTER — Other Ambulatory Visit: Payer: Self-pay | Admitting: Internal Medicine

## 2023-10-25 ENCOUNTER — Encounter: Payer: Self-pay | Admitting: Internal Medicine

## 2024-01-03 DIAGNOSIS — Z6823 Body mass index (BMI) 23.0-23.9, adult: Secondary | ICD-10-CM | POA: Diagnosis not present

## 2024-01-03 DIAGNOSIS — Z01419 Encounter for gynecological examination (general) (routine) without abnormal findings: Secondary | ICD-10-CM | POA: Diagnosis not present

## 2024-01-03 DIAGNOSIS — Z1231 Encounter for screening mammogram for malignant neoplasm of breast: Secondary | ICD-10-CM | POA: Diagnosis not present

## 2024-01-05 ENCOUNTER — Other Ambulatory Visit: Payer: Self-pay | Admitting: Obstetrics & Gynecology

## 2024-01-05 ENCOUNTER — Ambulatory Visit (INDEPENDENT_AMBULATORY_CARE_PROVIDER_SITE_OTHER): Payer: BC Managed Care – PPO | Admitting: Internal Medicine

## 2024-01-05 ENCOUNTER — Encounter: Payer: Self-pay | Admitting: Internal Medicine

## 2024-01-05 VITALS — BP 108/70 | HR 51 | Temp 98.4°F | Ht 61.0 in | Wt 126.0 lb

## 2024-01-05 DIAGNOSIS — Z0000 Encounter for general adult medical examination without abnormal findings: Secondary | ICD-10-CM

## 2024-01-05 DIAGNOSIS — E7800 Pure hypercholesterolemia, unspecified: Secondary | ICD-10-CM | POA: Diagnosis not present

## 2024-01-05 DIAGNOSIS — J3089 Other allergic rhinitis: Secondary | ICD-10-CM

## 2024-01-05 DIAGNOSIS — Z23 Encounter for immunization: Secondary | ICD-10-CM | POA: Diagnosis not present

## 2024-01-05 DIAGNOSIS — M810 Age-related osteoporosis without current pathological fracture: Secondary | ICD-10-CM | POA: Diagnosis not present

## 2024-01-05 DIAGNOSIS — R928 Other abnormal and inconclusive findings on diagnostic imaging of breast: Secondary | ICD-10-CM

## 2024-01-05 MED ORDER — ATORVASTATIN CALCIUM 10 MG PO TABS: ORAL_TABLET | ORAL | 1 refills | Status: DC

## 2024-01-05 MED ORDER — AZELASTINE HCL 0.1 % NA SOLN: 2.0000 | Freq: Two times a day (BID) | NASAL | 1 refills | Status: DC

## 2024-01-05 NOTE — Progress Notes (Signed)
 I,Victoria T Deloria Lair, CMA,acting as a Neurosurgeon for Gwynneth Aliment, MD.,have documented all relevant documentation on the behalf of Gwynneth Aliment, MD,as directed by  Gwynneth Aliment, MD while in the presence of Gwynneth Aliment, MD.  Subjective:    Patient ID: Kim Newton , female    DOB: 1961/08/30 , 63 y.o.   MRN: [id number]  Chief Complaint  Patient presents with   Annual Exam   Hyperlipidemia    HPI  Patient presents today for annual exam. She reports compliance with medications. She has no specific questions or concerns. GYN : Mitchel Honour.     Discussed the use of AI scribe software for clinical note transcription with the patient, who gave verbal consent to proceed.  History of Present Illness Kim Newton is a 63 year old female who presents for an annual physical exam.  She experiences persistent nasal congestion, which is constant and requires frequent nose blowing. The symptoms are present year-round without significant relief, although she notes that this time of year may be worse. She is currently using montelukast and Xyzal, as recommended by her allergist.  She is managing osteoporosis, diagnosed in her spine. She takes vitamin D and a multivitamin containing calcium, as separate calcium supplements previously caused gastrointestinal discomfort. She is not currently on any specific osteoporosis medication and has not started any strength training exercises.  Her past medical history includes hyperlipidemia, for which she takes cholesterol medication three times a week. She also receives estrogen therapy from her gynecologist. She has a history of seeing a cardiologist and is due for a follow-up in October.  She is up to date with her mammogram and Pap smear screenings and is due for a bone density test in July. She received a pneumonia vaccine today.  In terms of her social history, she is covered by her husband's Nurse, learning disability and is not on Medicare. She is  planning a Industrial/product designer and has no upcoming travel plans. She reports not feeling hungry often and typically skips breakfast, although she did eat an apple on the day of the visit. She acknowledges that she may not drink enough water, especially in relation to her coffee consumption. No breast tenderness or heart disease symptoms. Bowel movements are not daily and she acknowledges insufficient water intake.   Past Medical History:  Diagnosis Date   Anal fistula    History of rectal abscess    Hyperlipidemia    PVC (premature ventricular contraction)    Wears glasses      Family History  Problem Relation Age of Onset   Colon cancer Mother 62   Myasthenia gravis Father      Current Outpatient Medications:    azelastine (ASTELIN) 0.1 % nasal spray, Place 2 sprays into both nostrils 2 (two) times daily. Use in each nostril as directed, Disp: 30 mL, Rfl: 1   estradiol (CLIMARA - DOSED IN MG/24 HR) 0.075 mg/24hr patch, Place 0.075 mg onto the skin once a week., Disp: , Rfl:    levocetirizine (XYZAL) 5 MG tablet, Take 5 mg by mouth at bedtime., Disp: , Rfl:    montelukast (SINGULAIR) 10 MG tablet, TAKE 1 TABLET BY MOUTH EVERY DAY, Disp: 90 tablet, Rfl: 2   Multiple Vitamin (MULTIVITAMIN) tablet, Take 1 tablet by mouth daily., Disp: , Rfl:    progesterone (PROMETRIUM) 100 MG capsule, Take 100 mg by mouth at bedtime., Disp: , Rfl:    atorvastatin (LIPITOR) 10 MG tablet, TAKE 1 TABLET  BY MOUTH ON MONDAY, WEDNESDAY, FRIDAY AT BEDTIME., Disp: 36 tablet, Rfl: 1   No Known Allergies    The patient states she uses post menopausal status for birth control. Patient's last menstrual period was 12/03/2011.. Negative for Menorrhagia. Negative for: breast discharge, breast lump(s), breast pain and breast self exam. Associated symptoms include abnormal vaginal bleeding. Pertinent negatives include abnormal bleeding (hematology), anxiety, decreased libido, depression, difficulty falling sleep,  dyspareunia, history of infertility, nocturia, sexual dysfunction, sleep disturbances, urinary incontinence, urinary urgency, vaginal discharge and vaginal itching. Diet regular.The patient states her exercise level is  intermittent.  . The patient's tobacco use is:  Social History   Tobacco Use  Smoking Status Never   Passive exposure: Never  Smokeless Tobacco Never  . She has been exposed to passive smoke. The patient's alcohol use is:  Social History   Substance and Sexual Activity  Alcohol Use Yes   Alcohol/week: 2.0 standard drinks of alcohol   Types: 2 Glasses of wine per week   Comment: occasional    Review of Systems  Constitutional: Negative.   HENT:  Positive for congestion and rhinorrhea.   Eyes: Negative.   Respiratory: Negative.    Cardiovascular: Negative.   Gastrointestinal: Negative.   Endocrine: Negative.   Genitourinary: Negative.   Musculoskeletal: Negative.   Skin: Negative.   Allergic/Immunologic: Negative.   Neurological: Negative.   Hematological: Negative.   Psychiatric/Behavioral: Negative.       Today's Vitals   01/05/24 1106  BP: 108/70  Pulse: (!) 51  Temp: 98.4 F (36.9 C)  SpO2: 98%  Weight: 126 lb (57.2 kg)  Height: 5\' 1"  (1.549 m)   Body mass index is 23.81 kg/m.  Wt Readings from Last 3 Encounters:  01/05/24 126 lb (57.2 kg)  08/02/23 126 lb (57.2 kg)  07/05/23 124 lb (56.2 kg)     Objective:  Physical Exam Vitals and nursing note reviewed.  Constitutional:      Appearance: Normal appearance.  HENT:     Head: Normocephalic and atraumatic.     Right Ear: Tympanic membrane, ear canal and external ear normal.     Left Ear: Tympanic membrane, ear canal and external ear normal.     Mouth/Throat:     Pharynx: No oropharyngeal exudate or posterior oropharyngeal erythema.  Eyes:     Extraocular Movements: Extraocular movements intact.     Conjunctiva/sclera: Conjunctivae normal.     Pupils: Pupils are equal, round, and  reactive to light.  Cardiovascular:     Rate and Rhythm: Normal rate and regular rhythm.     Pulses: Normal pulses.     Heart sounds: Normal heart sounds.  Pulmonary:     Effort: Pulmonary effort is normal.     Breath sounds: Normal breath sounds.  Chest:  Breasts:    Tanner Score is 5.     Right: Normal.     Left: Normal.  Abdominal:     General: Abdomen is flat. Bowel sounds are normal.     Palpations: Abdomen is soft.  Genitourinary:    Comments: deferred Musculoskeletal:        General: Normal range of motion.     Cervical back: Normal range of motion and neck supple.  Skin:    General: Skin is warm and dry.  Neurological:     General: No focal deficit present.     Mental Status: She is alert and oriented to person, place, and time.  Psychiatric:  Mood and Affect: Mood normal.        Behavior: Behavior normal.         Assessment And Plan:     Routine general medical examination at a health care facility Assessment & Plan: A full exam was performed.  Importance of monthly self breast exams was discussed with the patient.  She is advised to get 30-45 minutes of regular exercise, no less than four to five days per week. Both weight-bearing and aerobic exercises are recommended.  She is advised to follow a healthy diet with at least six fruits/veggies per day, decrease intake of red meat and other saturated fats and to increase fish intake to twice weekly.  Meats/fish should not be fried -- baked, boiled or broiled is preferable. It is also important to cut back on your sugar intake.  Be sure to read labels - try to avoid anything with added sugar, high fructose corn syrup or other sweeteners.  If you must use a sweetener, you can try stevia or monkfruit.  It is also important to avoid artificially sweetened foods/beverages and diet drinks. Lastly, wear SPF 50 sunscreen on exposed skin and when in direct sunlight for an extended period of time.  Be sure to avoid fast food  restaurants and aim for at least 60 ounces of water daily.      Orders: -     CBC -     CMP14+EGFR -     Lipid panel  Pure hypercholesterolemia Assessment & Plan: Chronic, currently taking atorvastatin 10mg  w/ MWF dosing. She is hesitant to take daily dosing. She is encouraged to follow heart healthy diet.    Non-seasonal allergic rhinitis due to other allergic trigger Assessment & Plan: Persistent nasal congestion without seasonal variation. Currently on montelukast and Xyzal. - Prescribe non-steroidal nasal spray, one spray in each nostril daily, with option to increase. - Continue montelukast and Xyzal during peak allergy season. - Reassess montelukast need in summer.   Osteoporosis without current pathological fracture, unspecified osteoporosis type Assessment & Plan: Osteoporosis in spine. Discussed strength training and treatment options including Fosamax and injectables. Vitamin D and calcium through multivitamin; additional calcium caused GI discomfort. - Order blood work for secondary causes, including parathyroid hormone and TSH. - Refer to osteoporosis clinic for evaluation and treatment discussion. - Encourage strength training exercises. - Continue vitamin D and calcium through multivitamin.  Orders: -     TSH -     PTH, intact and calcium  Immunization due -     Pneumococcal polysaccharide vaccine 23-valent greater than or equal to 2yo subcutaneous/IM  Other orders -     Azelastine HCl; Place 2 sprays into both nostrils 2 (two) times daily. Use in each nostril as directed  Dispense: 30 mL; Refill: 1 -     Atorvastatin Calcium; TAKE 1 TABLET BY MOUTH ON MONDAY, WEDNESDAY, FRIDAY AT BEDTIME.  Dispense: 36 tablet; Refill: 1    Return for 1 year HM, 6 month chol f/u.Marland Kitchen Patient was given opportunity to ask questions. Patient verbalized understanding of the plan and was able to repeat key elements of the plan. All questions were answered to their satisfaction.   I,  Gwynneth Aliment, MD, have reviewed all documentation for this visit. The documentation on 01/05/24 for the exam, diagnosis, procedures, and orders are all accurate and complete.

## 2024-01-05 NOTE — Assessment & Plan Note (Addendum)
 Chronic, currently taking atorvastatin 10mg  w/ MWF dosing. She is hesitant to take daily dosing. She is encouraged to follow heart healthy diet.

## 2024-01-05 NOTE — Patient Instructions (Signed)

## 2024-01-05 NOTE — Assessment & Plan Note (Signed)

## 2024-01-05 NOTE — Assessment & Plan Note (Signed)
 Persistent nasal congestion without seasonal variation. Currently on montelukast and Xyzal. - Prescribe non-steroidal nasal spray, one spray in each nostril daily, with option to increase. - Continue montelukast and Xyzal during peak allergy season. - Reassess montelukast need in summer.

## 2024-01-05 NOTE — Assessment & Plan Note (Signed)
 Osteoporosis in spine. Discussed strength training and treatment options including Fosamax and injectables. Vitamin D and calcium through multivitamin; additional calcium caused GI discomfort. - Order blood work for secondary causes, including parathyroid hormone and TSH. - Refer to osteoporosis clinic for evaluation and treatment discussion. - Encourage strength training exercises. - Continue vitamin D and calcium through multivitamin.

## 2024-01-06 ENCOUNTER — Encounter: Payer: Self-pay | Admitting: Internal Medicine

## 2024-01-06 LAB — CMP14+EGFR
ALT: 11 IU/L (ref 0–32)
AST: 14 IU/L (ref 0–40)
Albumin: 4.3 g/dL (ref 3.9–4.9)
Alkaline Phosphatase: 45 IU/L (ref 44–121)
BUN/Creatinine Ratio: 16 (ref 12–28)
BUN: 13 mg/dL (ref 8–27)
Bilirubin Total: 0.5 mg/dL (ref 0.0–1.2)
CO2: 23 mmol/L (ref 20–29)
Calcium: 10 mg/dL (ref 8.7–10.3)
Chloride: 102 mmol/L (ref 96–106)
Creatinine, Ser: 0.83 mg/dL (ref 0.57–1.00)
Globulin, Total: 2.7 g/dL (ref 1.5–4.5)
Glucose: 78 mg/dL (ref 70–99)
Potassium: 4.9 mmol/L (ref 3.5–5.2)
Sodium: 140 mmol/L (ref 134–144)
Total Protein: 7 g/dL (ref 6.0–8.5)
eGFR: 78 mL/min/{1.73_m2} (ref >59)

## 2024-01-06 LAB — LIPID PANEL
Chol/HDL Ratio: 2.1 ratio (ref 0.0–4.4)
Cholesterol, Total: 192 mg/dL (ref 100–199)
HDL: 91 mg/dL (ref >39)
LDL Chol Calc (NIH): 88 mg/dL (ref 0–99)
Triglycerides: 69 mg/dL (ref 0–149)
VLDL Cholesterol Cal: 13 mg/dL (ref 5–40)

## 2024-01-06 LAB — CBC
Hematocrit: 40.9 % (ref 34.0–46.6)
Hemoglobin: 13.8 g/dL (ref 11.1–15.9)
MCH: 32.2 pg (ref 26.6–33.0)
MCHC: 33.7 g/dL (ref 31.5–35.7)
MCV: 96 fL (ref 79–97)
Platelets: 294 10*3/uL (ref 150–450)
RBC: 4.28 x10E6/uL (ref 3.77–5.28)
RDW: 11.9 % (ref 11.7–15.4)
WBC: 3.2 10*3/uL — ABNORMAL LOW (ref 3.4–10.8)

## 2024-01-06 LAB — PTH, INTACT AND CALCIUM: PTH: 17 pg/mL (ref 15–65)

## 2024-01-06 LAB — TSH: TSH: 1.01 u[IU]/mL (ref 0.450–4.500)

## 2024-01-17 ENCOUNTER — Ambulatory Visit
Admission: RE | Admit: 2024-01-17 | Discharge: 2024-01-17 | Disposition: A | Discharge status: Home / Self Care | Source: Ambulatory Visit | Attending: Obstetrics & Gynecology | Admitting: Obstetrics & Gynecology

## 2024-01-17 ENCOUNTER — Other Ambulatory Visit: Payer: Self-pay | Admitting: Obstetrics & Gynecology

## 2024-01-17 DIAGNOSIS — R928 Other abnormal and inconclusive findings on diagnostic imaging of breast: Secondary | ICD-10-CM

## 2024-01-17 DIAGNOSIS — N6489 Other specified disorders of breast: Secondary | ICD-10-CM

## 2024-01-17 DIAGNOSIS — N6012 Diffuse cystic mastopathy of left breast: Secondary | ICD-10-CM | POA: Diagnosis not present

## 2024-01-31 ENCOUNTER — Other Ambulatory Visit: Payer: Self-pay | Admitting: Internal Medicine

## 2024-02-08 ENCOUNTER — Encounter: Payer: Self-pay | Admitting: Cardiology

## 2024-02-08 NOTE — Telephone Encounter (Signed)
 Spoke with the patient who states that she has been feeling fine. No symptoms of lightheadedness, dizziness, or fatigue. She states that sometimes she gets low heart rate readings from her Apple Watch when she is taking a nap. Today is the first time that she has gotten these alerts while she was awake so she became concerned. She does not have a smart watch with ECG capability. She does have a history of frequent PVC's however she is asymptomatic with these. Advised that PVC's could be causing a falsely low HR reading. Advised on ER precautions for symptomatic low heart rates. Message sent to Dr. Marven Slimmer for further recommendations.

## 2024-02-19 ENCOUNTER — Encounter: Payer: Self-pay | Admitting: Cardiology

## 2024-04-06 DIAGNOSIS — Z13820 Encounter for screening for osteoporosis: Secondary | ICD-10-CM | POA: Diagnosis not present

## 2024-04-06 LAB — HM DEXA SCAN

## 2024-05-10 ENCOUNTER — Encounter: Payer: Self-pay | Admitting: Obstetrics & Gynecology

## 2024-06-28 ENCOUNTER — Other Ambulatory Visit: Payer: Self-pay | Admitting: Internal Medicine

## 2024-07-10 ENCOUNTER — Encounter: Payer: Self-pay | Admitting: Internal Medicine

## 2024-07-10 ENCOUNTER — Ambulatory Visit (INDEPENDENT_AMBULATORY_CARE_PROVIDER_SITE_OTHER): Admitting: Internal Medicine

## 2024-07-10 VITALS — BP 110/60 | HR 78 | Temp 98.2°F | Ht 61.0 in | Wt 128.0 lb

## 2024-07-10 DIAGNOSIS — M810 Age-related osteoporosis without current pathological fracture: Secondary | ICD-10-CM

## 2024-07-10 DIAGNOSIS — J3089 Other allergic rhinitis: Secondary | ICD-10-CM

## 2024-07-10 DIAGNOSIS — M79651 Pain in right thigh: Secondary | ICD-10-CM | POA: Diagnosis not present

## 2024-07-10 DIAGNOSIS — E7800 Pure hypercholesterolemia, unspecified: Secondary | ICD-10-CM | POA: Diagnosis not present

## 2024-07-10 MED ORDER — ESTRADIOL 0.05 MG/24HR TD PTWK: 0.0500 mg | MEDICATED_PATCH | TRANSDERMAL | Status: AC

## 2024-07-10 NOTE — Assessment & Plan Note (Signed)
 Chronic, currently taking atorvastatin 10mg  w/ MWF dosing. She is hesitant to take daily dosing. She is encouraged to follow heart healthy diet.

## 2024-07-10 NOTE — Assessment & Plan Note (Signed)
 Intermittent lateral thigh pain, possibly neuralgia paresthetica. No burning pain. Back issues present, lacks regular stretching. - Recommend figure four stretch and seated toe touch exercises. - Suggest over-the-counter lidocaine patch for pain relief during flares. - Encourage hourly stretching or walkin

## 2024-07-10 NOTE — Patient Instructions (Signed)
 Exercising to Stay Healthy To become healthy and stay healthy, it is recommended that you do moderate-intensity and vigorous-intensity exercise. You can tell that you are exercising at a moderate intensity if your heart starts beating faster and you start breathing faster but can still hold a conversation. You can tell that you are exercising at a vigorous intensity if you are breathing much harder and faster and cannot hold a conversation while exercising. How can exercise benefit me? Exercising regularly is important. It has many health benefits, such as: Improving overall fitness, flexibility, and endurance. Increasing bone density. Helping with weight control. Decreasing body fat. Increasing muscle strength and endurance. Reducing stress and tension, anxiety, depression, or anger. Improving overall health. What guidelines should I follow while exercising? Before you start a new exercise program, talk with your health care provider. Do not exercise so much that you hurt yourself, feel dizzy, or get very short of breath. Wear comfortable clothes and wear shoes with good support. Drink plenty of water while you exercise to prevent dehydration or heat stroke. Work out until your breathing and your heartbeat get faster (moderate intensity). How often should I exercise? Choose an activity that you enjoy, and set realistic goals. Your health care provider can help you make an activity plan that is individually designed and works best for you. Exercise regularly as told by your health care provider. This may include: Doing strength training two times a week, such as: Lifting weights. Using resistance bands. Push-ups. Sit-ups. Yoga. Doing a certain intensity of exercise for a given amount of time. Choose from these options: A total of 150 minutes of moderate-intensity exercise every week. A total of 75 minutes of vigorous-intensity exercise every week. A mix of moderate-intensity and  vigorous-intensity exercise every week. Children, pregnant women, people who have not exercised regularly, people who are overweight, and older adults may need to talk with a health care provider about what activities are safe to perform. If you have a medical condition, be sure to talk with your health care provider before you start a new exercise program. What are some exercise ideas? Moderate-intensity exercise ideas include: Walking 1 mile (1.6 km) in about 15 minutes. Biking. Hiking. Golfing. Dancing. Water aerobics. Vigorous-intensity exercise ideas include: Walking 4.5 miles (7.2 km) or more in about 1 hour. Jogging or running 5 miles (8 km) in about 1 hour. Biking 10 miles (16.1 km) or more in about 1 hour. Lap swimming. Roller-skating or in-line skating. Cross-country skiing. Vigorous competitive sports, such as football, basketball, and soccer. Jumping rope. Aerobic dancing. What are some everyday activities that can help me get exercise? Yard work, such as: Child psychotherapist. Raking and bagging leaves. Washing your car. Pushing a stroller. Shoveling snow. Gardening. Washing windows or floors. How can I be more active in my day-to-day activities? Use stairs instead of an elevator. Take a walk during your lunch break. If you drive, park your car farther away from your work or school. If you take public transportation, get off one stop early and walk the rest of the way. Stand up or walk around during all of your indoor phone calls. Get up, stretch, and walk around every 30 minutes throughout the day. Enjoy exercise with a friend. Support to continue exercising will help you keep a regular routine of activity. Where to find more information You can find more information about exercising to stay healthy from: U.S. Department of Health and Human Services: ThisPath.fi Centers for Disease Control and Prevention (  CDC): FootballExhibition.com.br Summary Exercising regularly is  important. It will improve your overall fitness, flexibility, and endurance. Regular exercise will also improve your overall health. It can help you control your weight, reduce stress, and improve your bone density. Do not exercise so much that you hurt yourself, feel dizzy, or get very short of breath. Before you start a new exercise program, talk with your health care provider. This information is not intended to replace advice given to you by your health care provider. Make sure you discuss any questions you have with your health care provider. Document Revised: 01/17/2021 Document Reviewed: 01/17/2021 Elsevier Patient Education  2024 [employer].

## 2024-07-10 NOTE — Assessment & Plan Note (Signed)
 Persistent nasal drainage and cough, likely exacerbated by allergies. - Use nasal spray three times weekly, aim towards opposite ear. - Consider Claritin three times weekly in the morning, separate from Xyzal.

## 2024-07-10 NOTE — Assessment & Plan Note (Signed)
 Bone density scan in March 2023 showed osteoporosis in spine, osteopenia in hip. On vitamin D, not calcium . No current osteoporosis treatment. - Request recent bone density scan results from Dr. Dannielle. - Refer to osteoporosis clinic for evaluation and treatment discussion. - Encourage strength training exercises.

## 2024-07-10 NOTE — Progress Notes (Signed)
 I,Kim Newton, CMA,acting as a Neurosurgeon for Kim LOISE Slocumb, MD.,have documented all relevant documentation on the behalf of Kim LOISE Slocumb, MD,as directed by  Kim LOISE Slocumb, MD while in the presence of Kim LOISE Slocumb, MD.  Subjective:  Patient ID: Kim Newton , female    DOB: 03/05/1961 , 63 y.o.   MRN: [id number]  Chief Complaint  Patient presents with   Hyperlipidemia    Patient presents today for chol f/u. Patient reports compliance with her meds. She denies having any headache, chest pain and shortness of breath. Patient reports she has been dealing with a productive cough for the past month.     HPI Discussed the use of AI scribe software for clinical note transcription with the patient, who gave verbal consent to proceed.  History of Present Illness Kim Newton is a 63 year old female who presents for a cholesterol check and reports a persistent cough.  She has been experiencing a persistent 'flimmy' cough for about a month, which she attributes to allergies. She frequently blows her nose and sniffles. Her current medications include montelukast  daily, Xyzal at night, and occasional use of a nasal spray. Despite these treatments, she continues to have nasal drainage. No fever or chills are present.  She experiences intermittent leg pain, which she describes as coming and going, but she is unsure if it is a burning sensation. She has not identified any specific triggers for the pain. She mentions having some back issues and is not currently engaging in regular stretching exercises.  She is scheduled for a follow-up mammogram next week due to a previous finding that required a diagnostic mammogram. The previous mammogram showed a spot that was difficult to see due to dense breast tissue.  She has a history of osteoporosis in the spine and osteopenia in the hip, with the last bone density scan conducted in March 2023. She is currently taking vitamin D but not calcium   supplements.      Hyperlipidemia This is a chronic problem. The current episode started more than 1 month ago. There are no known factors aggravating her hyperlipidemia. Current antihyperlipidemic treatment includes statins. Risk factors for coronary artery disease include dyslipidemia.     Past Medical History:  Diagnosis Date   Anal fistula    History of rectal abscess    Hyperlipidemia    PVC (premature ventricular contraction)    Wears glasses      Family History  Problem Relation Age of Onset   Colon cancer Mother 5   Myasthenia gravis Father      Current Outpatient Medications:    atorvastatin  (LIPITOR) 10 MG tablet, TAKE 1 TABLET BY MOUTH ON MONDAY, WEDNESDAY, FRIDAY AT BEDTIME., Disp: 36 tablet, Rfl: 1   Azelastine  HCl 137 MCG/SPRAY SOLN, PLACE 2 SPRAYS INTO BOTH NOSTRILS 2 (TWO) TIMES DAILY. USE IN EACH NOSTRIL AS DIRECTED, Disp: 30 mL, Rfl: 1   estradiol (CLIMARA) 0.05 mg/24hr patch, Place 1 patch (0.05 mg total) onto the skin once a week., Disp: , Rfl:    levocetirizine (XYZAL) 5 MG tablet, Take 5 mg by mouth at bedtime., Disp: , Rfl:    montelukast  (SINGULAIR ) 10 MG tablet, TAKE 1 TABLET BY MOUTH EVERY DAY, Disp: 90 tablet, Rfl: 2   Multiple Vitamin (MULTIVITAMIN) tablet, Take 1 tablet by mouth daily., Disp: , Rfl:    progesterone (PROMETRIUM) 100 MG capsule, Take 100 mg by mouth at bedtime., Disp: , Rfl:    No Known Allergies  Review of Systems  Constitutional: Negative.   HENT:  Positive for postnasal drip.   Respiratory: Negative.    Cardiovascular: Negative.   Gastrointestinal: Negative.   Neurological: Negative.   Psychiatric/Behavioral: Negative.       Today's Vitals   07/10/24 0832  BP: 110/60  Pulse: 78  Temp: 98.2 F (36.8 C)  TempSrc: Oral  Weight: 128 lb (58.1 kg)  Height: 5' 1 (1.549 m)  PainSc: 0-No pain   Body mass index is 24.19 kg/m.  Wt Readings from Last 3 Encounters:  07/10/24 128 lb (58.1 kg)  01/05/24 126 lb (57.2 kg)   08/02/23 126 lb (57.2 kg)    The 10-year ASCVD risk score (Arnett DK, et al., 2019) is: 4.8%   Values used to calculate the score:     Age: 76 years     Clincally relevant sex: Female     Is Non-Hispanic African American: Yes     Diabetic: No     Tobacco smoker: No     Systolic Blood Pressure: 110 mmHg     Is BP treated: No     HDL Cholesterol: 91 mg/dL     Total Cholesterol: 192 mg/dL  Objective:  Physical Exam Vitals and nursing note reviewed.  Constitutional:      Appearance: Normal appearance.  HENT:     Head: Normocephalic and atraumatic.  Eyes:     Extraocular Movements: Extraocular movements intact.  Cardiovascular:     Rate and Rhythm: Normal rate and regular rhythm.     Heart sounds: Normal heart sounds.  Pulmonary:     Effort: Pulmonary effort is normal.     Breath sounds: Normal breath sounds.  Musculoskeletal:     Cervical back: Normal range of motion.  Skin:    General: Skin is warm.  Neurological:     General: No focal deficit present.     Mental Status: She is alert.  Psychiatric:        Mood and Affect: Mood normal.        Behavior: Behavior normal.         Assessment And Plan:  Pure hypercholesterolemia Assessment & Plan: Chronic, currently taking atorvastatin  10mg  w/ MWF dosing. She is hesitant to take daily dosing. She is encouraged to follow heart healthy diet.   Orders: -     CMP14+EGFR -     Lipid panel  Non-seasonal allergic rhinitis due to other allergic trigger Assessment & Plan: Persistent nasal drainage and cough, likely exacerbated by allergies. - Use nasal spray three times weekly, aim towards opposite ear. - Consider Claritin three times weekly in the morning, separate from Xyzal.   Right thigh pain Assessment & Plan: Intermittent lateral thigh pain, possibly neuralgia paresthetica. No burning pain. Back issues present, lacks regular stretching. - Recommend figure four stretch and seated toe touch exercises. - Suggest  over-the-counter lidocaine patch for pain relief during flares. - Encourage hourly stretching or walkin   Osteoporosis without current pathological fracture, unspecified osteoporosis type Assessment & Plan: Bone density scan in March 2023 showed osteoporosis in spine, osteopenia in hip. On vitamin D, not calcium . No current osteoporosis treatment. - Request recent bone density scan results from Dr. Dannielle. - Refer to osteoporosis clinic for evaluation and treatment discussion. - Encourage strength training exercises.  Orders: -     Amb Referral to Osteoporosis Management   Other orders -     Estradiol; Place 1 patch (0.05 mg total) onto the skin once a week.  Return if symptoms worsen or fail to improve.  Patient was given opportunity to ask questions. Patient verbalized understanding of the plan and was able to repeat key elements of the plan. All questions were answered to their satisfaction.    I, Kim LOISE Slocumb, MD, have reviewed all documentation for this visit. The documentation on 07/10/24 for the exam, diagnosis, procedures, and orders are all accurate and complete.   IF YOU HAVE BEEN REFERRED TO A SPECIALIST, IT MAY TAKE 1-2 WEEKS TO SCHEDULE/PROCESS THE REFERRAL. IF YOU HAVE NOT HEARD FROM US /SPECIALIST IN TWO WEEKS, PLEASE GIVE US  A CALL AT [phone].

## 2024-07-11 ENCOUNTER — Ambulatory Visit: Payer: Self-pay | Admitting: Internal Medicine

## 2024-07-11 LAB — CMP14+EGFR
ALT: 13 IU/L (ref 0–32)
AST: 16 IU/L (ref 0–40)
Albumin: 3.9 g/dL (ref 3.9–4.9)
Alkaline Phosphatase: 45 IU/L — ABNORMAL LOW (ref 49–135)
BUN/Creatinine Ratio: 12 (ref 12–28)
BUN: 12 mg/dL (ref 8–27)
Bilirubin Total: 0.5 mg/dL (ref 0.0–1.2)
CO2: 22 mmol/L (ref 20–29)
Calcium: 9.1 mg/dL (ref 8.7–10.3)
Chloride: 106 mmol/L (ref 96–106)
Creatinine, Ser: 0.97 mg/dL (ref 0.57–1.00)
Globulin, Total: 2.5 g/dL (ref 1.5–4.5)
Glucose: 80 mg/dL (ref 70–99)
Potassium: 4.3 mmol/L (ref 3.5–5.2)
Sodium: 140 mmol/L (ref 134–144)
Total Protein: 6.4 g/dL (ref 6.0–8.5)
eGFR: 65 mL/min/1.73 (ref >59)

## 2024-07-11 LAB — LIPID PANEL
Chol/HDL Ratio: 2.1 ratio (ref 0.0–4.4)
Cholesterol, Total: 178 mg/dL (ref 100–199)
HDL: 84 mg/dL (ref >39)
LDL Chol Calc (NIH): 76 mg/dL (ref 0–99)
Triglycerides: 100 mg/dL (ref 0–149)
VLDL Cholesterol Cal: 18 mg/dL (ref 5–40)

## 2024-07-12 ENCOUNTER — Ambulatory Visit: Payer: Self-pay | Admitting: Internal Medicine

## 2024-07-17 ENCOUNTER — Encounter: Admitting: Physician Assistant

## 2024-07-17 ENCOUNTER — Ambulatory Visit: Admitting: Physician Assistant

## 2024-07-17 ENCOUNTER — Encounter: Payer: Self-pay | Admitting: Physician Assistant

## 2024-07-17 DIAGNOSIS — M810 Age-related osteoporosis without current pathological fracture: Secondary | ICD-10-CM

## 2024-07-17 NOTE — Addendum Note (Signed)
 Addended by: MYRNA VEVA HERO on: 07/17/2024 10:19 AM   Modules accepted: Orders

## 2024-07-17 NOTE — Progress Notes (Signed)
 Office Visit Note   Patient: Kim Newton           Date of Birth: 01-13-1961           MRN: [id number] Visit Date: 07/17/2024              Requested by: Jarold Medici, MD [address] [place],  [place] [address] PCP: Jarold Medici, MD   Assessment & Plan: Visit Diagnoses:  1. Age-related osteoporosis without current pathological fracture     Plan: Kim Newton is a very pleasant 63 year old woman who is accompanied by her husband today.  She is here for evaluation of osteoporosis.  She has never taken medication in the past.  She has no history of fracture no history of heart disease.  No history of cancer.  No history of kidney disease or GI issues.  She does not have reflux.  She does not have a history of epilepsy or seizures.  She went through menopause when she was 48 she is currently taking progesterone.  She has never been a smoker she drinks occasionally.  She walks 1 day a week she takes calcium  and a multivitamin and 5000 international units of vitamin D she has never had any major dental work and no family history of spine or fragility fractures.  Her most recent bone density scan showed her in osteopenia with the femur being the lowest at -2.4.  She has very little risk factor factors.  I spent 40 minutes reviewing her chart and discussing osteoporosis treatments with her.  I did do her FRAX score which was 13% risk of major osteoporotic fracture in the next 10 years and 2.6.  For the hip.  Both of these are below the average.  I really do think she should try and make some adjustments in her diet we will measure her vitamin D today.  I did give her samples of some calcium  supplements that she may find more palatable.  She is not really doing any resistive training as far as exercise and I think this may help her as well.  Would have her continue doing this and recheck bone density in 2 years through her primary care  Follow-Up Instructions: No follow-ups on file.   Orders:   No orders of the defined types were placed in this encounter.  No orders of the defined types were placed in this encounter.     Procedures: No procedures performed   Clinical Data: No additional findings.   Subjective: Chief Complaint  Patient presents with   osteoporosis management    HPI pleasant 63 year old woman referred by Dr. Jarold for evaluation of osteoporosis  Review of Systems  All other systems reviewed and are negative.    Objective: Vital Signs: Ht 5' 0.75 (1.543 m)   Wt 126 lb (57.2 kg)   LMP 12/03/2011   BMI 24.00 kg/m   Physical Exam Constitutional:      Appearance: Normal appearance.  Pulmonary:     Effort: Pulmonary effort is normal.  Skin:    General: Skin is warm and dry.  Neurological:     General: No focal deficit present.     Mental Status: She is alert and oriented to person, place, and time.  Psychiatric:        Mood and Affect: Mood normal.     Ortho Exam  Specialty Comments:  No specialty comments available.  Imaging: No results found.   PMFS History: Patient Active Problem List  Diagnosis Date Noted   Right thigh pain 07/10/2024   Age-related osteoporosis without current pathological fracture 01/05/2024   Chronic right-sided low back pain with right-sided sciatica 07/05/2023   BMI 23.0-23.9, adult 06/02/2022   Allergic rhinitis 10/28/2019   Hyperlipidemia 11/23/2017   Routine general medical examination at a health care facility 11/23/2017   Past Medical History:  Diagnosis Date   Anal fistula    History of rectal abscess    Hyperlipidemia    PVC (premature ventricular contraction)    Wears glasses     Family History  Problem Relation Age of Onset   Colon cancer Mother 72   Myasthenia gravis Father     Past Surgical History:  Procedure Laterality Date   CESAREAN SECTION  x2 last one 1995   REPAIR UMBILICAL AND VENTRAL HERNIA'S  01-29-2004    dr gerkin  [facility]   Social History   Occupational  History   Not on file  Tobacco Use   Smoking status: Never    Passive exposure: Never   Smokeless tobacco: Never  Substance and Sexual Activity   Alcohol use: Yes    Alcohol/week: 2.0 standard drinks of alcohol    Types: 2 Glasses of wine per week    Comment: occasional   Drug use: Never   Sexual activity: Not on file

## 2024-07-18 LAB — VITAMIN D 25 HYDROXY (VIT D DEFICIENCY, FRACTURES): Vit D, 25-Hydroxy: 45 ng/mL (ref 30–100)

## 2024-07-20 ENCOUNTER — Other Ambulatory Visit: Payer: Self-pay | Admitting: Internal Medicine

## 2024-07-20 ENCOUNTER — Ambulatory Visit

## 2024-07-20 ENCOUNTER — Ambulatory Visit
Admission: RE | Admit: 2024-07-20 | Discharge: 2024-07-20 | Disposition: A | Discharge status: Home / Self Care | Source: Ambulatory Visit | Attending: Obstetrics & Gynecology | Admitting: Obstetrics & Gynecology

## 2024-07-20 DIAGNOSIS — N6489 Other specified disorders of breast: Secondary | ICD-10-CM

## 2024-07-20 DIAGNOSIS — R928 Other abnormal and inconclusive findings on diagnostic imaging of breast: Secondary | ICD-10-CM

## 2024-08-01 ENCOUNTER — Encounter: Payer: Self-pay | Admitting: Internal Medicine

## 2024-08-02 ENCOUNTER — Encounter: Payer: Self-pay | Admitting: Internal Medicine

## 2024-08-07 ENCOUNTER — Encounter: Payer: Self-pay | Admitting: Radiology

## 2024-08-13 ENCOUNTER — Other Ambulatory Visit: Payer: Self-pay | Admitting: Internal Medicine

## 2024-09-08 ENCOUNTER — Encounter: Payer: Self-pay | Admitting: Internal Medicine

## 2024-09-18 NOTE — Progress Notes (Unsigned)
°  Electrophysiology Office Follow up Visit Note:    Date:  09/20/2024   ID:  Kim Newton, DOB 1961-04-26, MRN [id number]  PCP:  Jarold Medici, MD  [facility] Cardiologist:  Victory LELON Claudene DOUGLAS, MD (Inactive)  [facility] Electrophysiologist:  OLE ONEIDA HOLTS, MD    Interval History:     Kim Newton is a 63 y.o. female who presents for a follow up visit.   The patient last saw [facility] August 02, 2023.  She has a history of PVCs.  She is with her husband today who I previously met.  She is doing okay.  She remains active.  She tells me that after she climbs a lot of stairs and walking a long distance she may feel winded for a few seconds but this resolves.  No heart failure symptoms.  No syncope or presyncope.      Past medical, surgical, social and family history were reviewed.  ROS:   Please see the history of present illness.    All other systems reviewed and are negative.  EKGs/Labs/Other Studies Reviewed:    The following studies were reviewed today:          Physical Exam:    VS:  BP 104/64   Pulse (!) 50   Ht 5' 1 (1.549 m)   Wt 127 lb 12.8 oz (58 kg)   LMP 12/03/2011   SpO2 96%   BMI 24.15 kg/m     Wt Readings from Last 3 Encounters:  09/20/24 127 lb 12.8 oz (58 kg)  07/17/24 126 lb (57.2 kg)  07/10/24 128 lb (58.1 kg)     GEN: no distress CARD: Irregular rhythm, No MRG RESP: No IWOB. CTAB.      ASSESSMENT:    1. PVC (premature ventricular contraction)    PLAN:    In order of problems listed above:  #PVCs Normal EF.  Minimally symptomatic. I am going to repeat her echo since it has been about 2 years.  We reviewed the indications for PVC suppression again during today's clinic appointment.  If her symptom burden changes in the future or if her EF has decreased, favor PVC suppression.  We discussed some of the options for PVC suppression during today's clinic appointment but these would need to be reviewed in more detail in the  future should they be needed.  I discussed my upcoming departure from [facility] during today's clinic appointment.  The patient will continue to follow-up with one of my EP partners moving forward.  Follow-up in 3 months with EP after echo   Signed, Ole Holts, MD, [facility], [facility] 09/20/2024 3:59 PM    Electrophysiology [facility]

## 2024-09-20 ENCOUNTER — Encounter: Payer: Self-pay | Admitting: Cardiology

## 2024-09-20 ENCOUNTER — Ambulatory Visit: Attending: Cardiology | Admitting: Cardiology

## 2024-09-20 VITALS — BP 104/64 | HR 50 | Ht 61.0 in | Wt 127.8 lb

## 2024-09-20 DIAGNOSIS — I493 Ventricular premature depolarization: Secondary | ICD-10-CM

## 2024-09-20 NOTE — Patient Instructions (Signed)
 Medication Instructions:  Your physician recommends that you continue on your current medications as directed. Please refer to the Current Medication list given to you today.  *If you need a refill on your cardiac medications before your next appointment, please call your pharmacy*  Testing/Procedures: Echocardiogram Your physician has requested that you have an echocardiogram. Echocardiography is a painless test that uses sound waves to create images of your heart. It provides your doctor with information about the size and shape of your heart and how well your hearts chambers and valves are working. This procedure takes approximately one hour. There are no restrictions for this procedure. Please do NOT wear cologne, perfume, aftershave, or lotions (deodorant is allowed). Please arrive 15 minutes prior to your appointment time.  Please note: We ask at that you not bring children with you during ultrasound (echo/ vascular) testing. Due to room size and safety concerns, children are not allowed in the ultrasound rooms during exams. Our front office staff cannot provide observation of children in our lobby area while testing is being conducted. An adult accompanying a patient to their appointment will only be allowed in the ultrasound room at the discretion of the ultrasound technician under special circumstances. We apologize for any inconvenience.  Follow-Up: At [facility], you and your health needs are our priority.  As part of our continuing mission to provide you with exceptional heart care, our providers are all part of one team.  This team includes your primary Cardiologist (physician) and Advanced Practice Providers or APPs (Physician Assistants and Nurse Practitioners) who all work together to provide you with the care you need, when you need it.  Your next appointment:   3 months  Provider:   You may see Dr. Sidra Kitty or one of the following Advanced Practice Providers on  your designated Care Team:   Charlies Arthur, PA-C Ozell Jodie Passey, PA-C Suzann Riddle, NP Daphne Barrack, NP Artist Pouch, PA-C

## 2024-10-12 ENCOUNTER — Encounter: Payer: Self-pay | Admitting: Internal Medicine

## 2024-10-12 ENCOUNTER — Ambulatory Visit: Payer: Self-pay | Admitting: Internal Medicine

## 2024-10-12 VITALS — BP 120/78 | HR 50 | Temp 98.3°F | Ht 61.0 in | Wt 127.8 lb

## 2024-10-12 DIAGNOSIS — J309 Allergic rhinitis, unspecified: Secondary | ICD-10-CM | POA: Diagnosis not present

## 2024-10-12 DIAGNOSIS — R053 Chronic cough: Secondary | ICD-10-CM

## 2024-10-12 DIAGNOSIS — R0982 Postnasal drip: Secondary | ICD-10-CM | POA: Diagnosis not present

## 2024-10-12 MED ORDER — NOREL AD 4-10-325 MG PO TABS: ORAL_TABLET | ORAL | 0 refills | Status: AC

## 2024-10-12 NOTE — Patient Instructions (Addendum)
 Norel AD one twice daily as needed While on Norel, hold Xyzal Continue with montelukast  while on Norel  Send Mychart message on Monday to let Dr. Jarold know how you are feeling  Cough, Adult A cough helps to clear your throat and lungs. It may be a sign of an illness or another condition. A short-term (acute) cough may last 2-3 weeks. A long-term (chronic) cough may last 8 or more weeks. Many things can cause a cough. They include: Illnesses such as: An infection in your throat or lungs. Asthma or other heart or lung problems. Gastroesophageal reflux. This is when acid comes back up from your stomach. Breathing in things that bother (irritate) your lungs. Allergies. Postnasal drip. This is when mucus runs down the back of your throat. Smoking. Some medicines. Follow these instructions at home: Medicines Take over-the-counter and prescription medicines only as told by your doctor. Talk with your doctor before you take cough medicine (cough suppressants). Eating and drinking Do not drink alcohol. Do not drink caffeine. Drink enough fluid to keep your pee (urine) pale yellow. Lifestyle Stay away from cigarette smoke. Do not smoke or use any products that contain nicotine or tobacco. If you need help quitting, ask your doctor. Stay away from things that make you cough. These may include perfume, candles, cleaning products, or campfire smoke. General instructions  Watch for any changes to your cough. Tell your doctor about them. Always cover your mouth when you cough. If the air is dry in your home, use a cool mist vaporizer or humidifier. If your cough is worse at night, try using extra pillows to raise your head up higher while you sleep. Rest as needed. Contact a doctor if: You have new symptoms. Your symptoms get worse. You cough up pus. You have a fever that does not go away. Your cough does not get better after 2-3 weeks. Cough medicine does not help, and you are not  sleeping well. You have pain that gets worse or is not helped with medicine. You are losing weight and do not know why. You have night sweats. Get help right away if: You cough up blood. You have trouble breathing. Your heart is beating very fast. These symptoms may be an emergency. Get help right away. Call 911. Do not wait to see if the symptoms will go away. Do not drive yourself to the hospital. This information is not intended to replace advice given to you by your health care provider. Make sure you discuss any questions you have with your health care provider. Document Revised: 05/22/2022 Document Reviewed: 05/22/2022 Elsevier Patient Education  2024 [employer].

## 2024-10-12 NOTE — Progress Notes (Unsigned)
 I,Victoria T Emmitt, CMA,acting as a neurosurgeon for Catheryn LOISE Slocumb, MD.,have documented all relevant documentation on the behalf of Catheryn LOISE Slocumb, MD,as directed by  Catheryn LOISE Slocumb, MD while in the presence of Catheryn LOISE Slocumb, MD.  Subjective:  Patient ID: Kim Newton , female    DOB: 03/05/1961 , 64 y.o.   MRN: [id number]  Chief Complaint  Patient presents with   Cough    Patient presents today for cough. This started a month ago. She denies trying anything OTC. She admits this cough happens more at night & first thing in the morning. She also has a stuffy nose.  Denies fever/chills.      HPI Discussed the use of AI scribe software for clinical note transcription with the patient, who gave verbal consent to proceed.  History of Present Illness    HPI   Past Medical History:  Diagnosis Date   Anal fistula    History of rectal abscess    Hyperlipidemia    PVC (premature ventricular contraction)    Wears glasses      Family History  Problem Relation Age of Onset   Colon cancer Mother 85   Myasthenia gravis Father      Current Outpatient Medications:    atorvastatin  (LIPITOR) 10 MG tablet, TAKE 1 TABLET BY MOUTH ON MONDAY, WEDNESDAY, FRIDAY AT BEDTIME., Disp: 36 tablet, Rfl: 1   Azelastine  HCl 137 MCG/SPRAY SOLN, PLACE 2 SPRAYS INTO BOTH NOSTRILS 2 (TWO) TIMES DAILY. USE IN EACH NOSTRIL AS DIRECTED, Disp: 30 mL, Rfl: 1   Chlorphen-PE-Acetaminophen (NOREL AD) 4-10-325 MG TABS, One tab po twice daily prn, Disp: 20 tablet, Rfl: 0   estradiol  (CLIMARA ) 0.05 mg/24hr patch, Place 1 patch (0.05 mg total) onto the skin once a week., Disp: , Rfl:    levocetirizine (XYZAL) 5 MG tablet, Take 5 mg by mouth at bedtime., Disp: , Rfl:    montelukast  (SINGULAIR ) 10 MG tablet, TAKE 1 TABLET BY MOUTH EVERY DAY, Disp: 90 tablet, Rfl: 2   Multiple Vitamin (MULTIVITAMIN) tablet, Take 1 tablet by mouth daily., Disp: , Rfl:    progesterone (PROMETRIUM) 100 MG capsule, Take 100  mg by mouth at bedtime., Disp: , Rfl:    Allergies[1]   Review of Systems  Constitutional: Negative.   Respiratory: Negative.    Cardiovascular: Negative.   Neurological: Negative.   Psychiatric/Behavioral: Negative.       Today's Vitals   10/12/24 1211  BP: 120/78  Pulse: (!) 50  Temp: 98.3 F (36.8 C)  SpO2: 98%  Weight: 127 lb 12.8 oz (58 kg)  Height: 5' 1 (1.549 m)   Body mass index is 24.15 kg/m.  Wt Readings from Last 3 Encounters:  10/12/24 127 lb 12.8 oz (58 kg)  09/20/24 127 lb 12.8 oz (58 kg)  07/17/24 126 lb (57.2 kg)    The 10-year ASCVD risk score (Arnett DK, et al., 2019) is: 5.6%   Values used to calculate the score:     Age: 71 years     Clinically relevant sex: Female     Is Non-Hispanic African American: Yes     Diabetic: No     Tobacco smoker: No     Systolic Blood Pressure: 120 mmHg     Is BP treated: No     HDL Cholesterol: 84 mg/dL     Total Cholesterol: 178 mg/dL  Objective:  Physical Exam Vitals and nursing note reviewed.  Constitutional:      Appearance: Normal  appearance.  HENT:     Head: Normocephalic and atraumatic.  Eyes:     Extraocular Movements: Extraocular movements intact.  Cardiovascular:     Rate and Rhythm: Normal rate and regular rhythm.     Heart sounds: Normal heart sounds.  Pulmonary:     Effort: Pulmonary effort is normal.     Breath sounds: Normal breath sounds.  Musculoskeletal:     Cervical back: Normal range of motion.  Skin:    General: Skin is warm.  Neurological:     General: No focal deficit present.     Mental Status: She is alert.  Psychiatric:        Mood and Affect: Mood normal.        Behavior: Behavior normal.         Assessment And Plan:   Assessment & Plan Chronic cough   Assessment & Plan    No orders of the defined types were placed in this encounter.    Return if symptoms worsen or fail to improve.  Patient was given opportunity to ask questions. Patient verbalized  understanding of the plan and was able to repeat key elements of the plan. All questions were answered to their satisfaction.    I, Catheryn LOISE Slocumb, MD, have reviewed all documentation for this visit. The documentation on 10/12/2024 for the exam, diagnosis, procedures, and orders are all accurate and complete.   IF YOU HAVE BEEN REFERRED TO A SPECIALIST, IT MAY TAKE 1-2 WEEKS TO SCHEDULE/PROCESS THE REFERRAL. IF YOU HAVE NOT HEARD FROM US /SPECIALIST IN TWO WEEKS, PLEASE GIVE US  A CALL AT [phone].        [1] No Known Allergies

## 2024-10-17 ENCOUNTER — Encounter: Payer: Self-pay | Admitting: Internal Medicine

## 2024-10-17 ENCOUNTER — Other Ambulatory Visit: Payer: Self-pay | Admitting: Internal Medicine

## 2024-10-17 DIAGNOSIS — R053 Chronic cough: Secondary | ICD-10-CM

## 2024-10-17 DIAGNOSIS — J309 Allergic rhinitis, unspecified: Secondary | ICD-10-CM

## 2024-10-30 ENCOUNTER — Ambulatory Visit (HOSPITAL_COMMUNITY)

## 2024-12-01 ENCOUNTER — Ambulatory Visit (HOSPITAL_COMMUNITY)

## 2024-12-18 ENCOUNTER — Ambulatory Visit: Admitting: Physician Assistant

## 2024-12-28 ENCOUNTER — Ambulatory Visit: Admitting: Student

## 2024-12-29 ENCOUNTER — Ambulatory Visit: Admitting: Cardiology

## 2025-01-09 ENCOUNTER — Encounter: Payer: Self-pay | Admitting: Internal Medicine

## 2025-01-19 ENCOUNTER — Encounter
